# Patient Record
Sex: Female | Born: 1975 | Race: White | Hispanic: No | Marital: Single
Health system: Southern US, Community
[De-identification: ages and names within clinical notes are randomized; demographics above are authoritative.]

## PROBLEM LIST (undated history)

## (undated) DIAGNOSIS — F32A Depression, unspecified: Secondary | ICD-10-CM

## (undated) DIAGNOSIS — G709 Myoneural disorder, unspecified: Secondary | ICD-10-CM

## (undated) DIAGNOSIS — C801 Malignant (primary) neoplasm, unspecified: Secondary | ICD-10-CM

## (undated) DIAGNOSIS — M1990 Unspecified osteoarthritis, unspecified site: Secondary | ICD-10-CM

## (undated) DIAGNOSIS — K219 Gastro-esophageal reflux disease without esophagitis: Secondary | ICD-10-CM

## (undated) DIAGNOSIS — F419 Anxiety disorder, unspecified: Secondary | ICD-10-CM

## (undated) DIAGNOSIS — T7840XA Allergy, unspecified, initial encounter: Secondary | ICD-10-CM

## (undated) DIAGNOSIS — E785 Hyperlipidemia, unspecified: Secondary | ICD-10-CM

## (undated) DIAGNOSIS — G4700 Insomnia, unspecified: Secondary | ICD-10-CM

## (undated) HISTORY — DX: Unspecified osteoarthritis, unspecified site: M19.90

## (undated) HISTORY — DX: Hyperlipidemia, unspecified: E78.5

## (undated) HISTORY — PX: ABDOMINAL HYSTERECTOMY: SHX81

## (undated) HISTORY — PX: TUBAL LIGATION: SHX77

## (undated) HISTORY — DX: Malignant (primary) neoplasm, unspecified: C80.1

## (undated) HISTORY — DX: Depression, unspecified: F32.A

## (undated) HISTORY — DX: Anxiety disorder, unspecified: F41.9

## (undated) HISTORY — DX: Allergy, unspecified, initial encounter: T78.40XA

## (undated) HISTORY — DX: Insomnia, unspecified: G47.00

## (undated) HISTORY — DX: Gastro-esophageal reflux disease without esophagitis: K21.9

## (undated) HISTORY — DX: Myoneural disorder, unspecified: G70.9

---

## 2016-02-02 HISTORY — PX: OTHER SURGICAL HISTORY: SHX169

## 2016-02-03 DIAGNOSIS — C539 Malignant neoplasm of cervix uteri, unspecified: Secondary | ICD-10-CM | POA: Diagnosis not present

## 2016-06-09 DIAGNOSIS — D229 Melanocytic nevi, unspecified: Secondary | ICD-10-CM | POA: Diagnosis not present

## 2016-06-09 DIAGNOSIS — D492 Neoplasm of unspecified behavior of bone, soft tissue, and skin: Secondary | ICD-10-CM | POA: Diagnosis not present

## 2016-11-16 DIAGNOSIS — G2581 Restless legs syndrome: Secondary | ICD-10-CM | POA: Insufficient documentation

## 2016-11-16 DIAGNOSIS — F17200 Nicotine dependence, unspecified, uncomplicated: Secondary | ICD-10-CM | POA: Insufficient documentation

## 2016-11-16 DIAGNOSIS — D229 Melanocytic nevi, unspecified: Secondary | ICD-10-CM | POA: Insufficient documentation

## 2017-03-28 DIAGNOSIS — H6123 Impacted cerumen, bilateral: Secondary | ICD-10-CM | POA: Diagnosis not present

## 2017-03-28 DIAGNOSIS — I498 Other specified cardiac arrhythmias: Secondary | ICD-10-CM | POA: Diagnosis not present

## 2017-03-29 DIAGNOSIS — R002 Palpitations: Secondary | ICD-10-CM | POA: Diagnosis not present

## 2017-03-29 DIAGNOSIS — R079 Chest pain, unspecified: Secondary | ICD-10-CM | POA: Diagnosis not present

## 2019-02-19 LAB — HM PAP SMEAR: HM Pap smear: NEGATIVE

## 2019-07-27 LAB — HM MAMMOGRAPHY

## 2020-01-11 ENCOUNTER — Ambulatory Visit: Payer: Medicaid Other | Admitting: Adult Health

## 2020-01-11 ENCOUNTER — Encounter: Payer: Self-pay | Admitting: Adult Health

## 2020-01-11 ENCOUNTER — Other Ambulatory Visit: Payer: Self-pay

## 2020-01-11 VITALS — BP 126/77 | HR 88 | Temp 98.3°F | Resp 16 | Ht 69.0 in | Wt 153.6 lb

## 2020-01-11 DIAGNOSIS — Z90711 Acquired absence of uterus with remaining cervical stump: Secondary | ICD-10-CM

## 2020-01-11 DIAGNOSIS — G709 Myoneural disorder, unspecified: Secondary | ICD-10-CM | POA: Diagnosis not present

## 2020-01-11 DIAGNOSIS — Z1389 Encounter for screening for other disorder: Secondary | ICD-10-CM | POA: Insufficient documentation

## 2020-01-11 DIAGNOSIS — E559 Vitamin D deficiency, unspecified: Secondary | ICD-10-CM | POA: Diagnosis not present

## 2020-01-11 DIAGNOSIS — R350 Frequency of micturition: Secondary | ICD-10-CM

## 2020-01-11 DIAGNOSIS — Z87440 Personal history of urinary (tract) infections: Secondary | ICD-10-CM | POA: Diagnosis not present

## 2020-01-11 LAB — POCT URINALYSIS DIPSTICK
Bilirubin, UA: NEGATIVE
Blood, UA: NEGATIVE
Glucose, UA: NEGATIVE
Ketones, UA: NEGATIVE
Leukocytes, UA: NEGATIVE
Nitrite, UA: NEGATIVE
Protein, UA: NEGATIVE
Spec Grav, UA: <=1.005 — AB (ref 1.010–1.025)
Urobilinogen, UA: 0.2 E.U./dL
pH, UA: 6.5 (ref 5.0–8.0)

## 2020-01-11 MED ORDER — TRAZODONE HCL 50 MG PO TABS: 150.0000 mg | ORAL_TABLET | Freq: Every day | ORAL | 0 refills | Status: DC

## 2020-01-11 MED ORDER — TOPIRAMATE 100 MG PO TABS: 100.0000 mg | ORAL_TABLET | Freq: Every day | ORAL | 1 refills | Status: DC

## 2020-01-11 MED ORDER — CLONAZEPAM 0.5 MG PO TABS: 0.5000 mg | ORAL_TABLET | Freq: Two times a day (BID) | ORAL | 0 refills | Status: DC | PRN

## 2020-01-11 MED ORDER — FOSFOMYCIN TROMETHAMINE 3 G PO PACK: 3.0000 g | PACK | Freq: Once | ORAL | 0 refills | Status: AC

## 2020-01-11 NOTE — Patient Instructions (Addendum)
Advised patient call the office or your primary care doctor for an appointment if no improvement within 72 hours or if any symptoms change or worsen at any time  Advised ER or urgent Care if after hours or on weekend. Call 911 for emergency symptoms at any time.Patinet verbalized understanding of all instructions given/reviewed and treatment plan and has no further questions or concerns at this time.    Orders Placed This Encounter  Procedures  . CULTURE, URINE COMPREHENSIVE  . CBC with Differential/Platelet  . Comprehensive metabolic panel  . Lipid panel  . TSH  . VITAMIN D 25 Hydroxy (Vit-D Deficiency, Fractures)  . Urinalysis, microscopic only  . Ambulatory referral to Neurology    Referral Priority:   Urgent    Referral Type:   Consultation    Referral Reason:   Specialty Services Required    Requested Specialty:   Neurology    Number of Visits Requested:   1  . POCT urinalysis dipstick  requested Neuromuscular referral in comments  at [facility] - you should get a call within 2- 3 weeks let us know if you do not.   Urinary Frequency, Adult Urinary frequency means urinating more often than usual. You may urinate every 1-2 hours even though you drink a normal amount of fluid and do not have a bladder infection or condition. Although you urinate more often than normal, the total amount of urine produced in a day is normal. With urinary frequency, you may have an urgent need to urinate often. The stress and anxiety of needing to find a bathroom quickly can make this urge worse. This condition may go away on its own or you may need treatment at home. Home treatment may include bladder training, exercises, taking medicines, or making changes to your diet. Follow these instructions at home: Bladder health   Keep a bladder diary if told by your health care provider. Keep track of: ? What you eat and drink. ? How often you urinate. ? How much you urinate.  Follow a bladder training program if  told by your health care provider. This may include: ? Learning to delay going to the bathroom. ? Double urinating (voiding). This helps if you are not completely emptying your bladder. ? Scheduled voiding.  Do Kegel exercises as told by your health care provider. Kegel exercises strengthen the muscles that help control urination, which may help the condition. Eating and drinking  If told by your health care provider, make diet changes, such as: ? Avoiding caffeine. ? Drinking fewer fluids, especially alcohol. ? Not drinking in the evening. ? Avoiding foods or drinks that may irritate the bladder. These include coffee, tea, soda, artificial sweeteners, citrus, tomato-based foods, and chocolate. ? Eating foods that help prevent or ease constipation. Constipation can make this condition worse. Your health care provider may recommend that you:  Drink enough fluid to keep your urine pale yellow.  Take over-the-counter or prescription medicines.  Eat foods that are high in fiber, such as beans, whole grains, and fresh fruits and vegetables.  Limit foods that are high in fat and processed sugars, such as fried or sweet foods. General instructions  Take over-the-counter and prescription medicines only as told by your health care provider.  Keep all follow-up visits as told by your health care provider. This is important. Contact a health care provider if:  You start urinating more often.  You feel pain or irritation when you urinate.  You notice blood in your urine.  Your urine looks cloudy.  You develop a fever.  You begin vomiting. Get help right away if:  You are unable to urinate. Summary  Urinary frequency means urinating more often than usual. With urinary frequency, you may urinate every 1-2 hours even though you drink a normal amount of fluid and do not have a bladder infection or other bladder condition.  Your health care provider may recommend that you keep a bladder  diary, follow a bladder training program, or make dietary changes.  If told by your health care provider, do Kegel exercises to strengthen the muscles that help control urination.  Take over-the-counter and prescription medicines only as told by your health care provider.  Contact a health care provider if your symptoms do not improve or get worse. This information is not intended to replace advice given to you by your health care provider. Make sure you discuss any questions you have with your health care provider. Document Revised: 07/28/2017 Document Reviewed: 07/28/2017 Elsevier Patient Education  2020 [place] Maintenance, Female Adopting a healthy lifestyle and getting preventive care are important in promoting health and wellness. Ask your health care provider about:  The right schedule for you to have regular tests and exams.  Things you can do on your own to prevent diseases and keep yourself healthy. What should I know about diet, weight, and exercise? Eat a healthy diet   Eat a diet that includes plenty of vegetables, fruits, low-fat dairy products, and lean protein.  Do not eat a lot of foods that are high in solid fats, added sugars, or sodium. Maintain a healthy weight Body mass index (BMI) is used to identify weight problems. It estimates body fat based on height and weight. Your health care provider can help determine your BMI and help you achieve or maintain a healthy weight. Get regular exercise Get regular exercise. This is one of the most important things you can do for your health. Most adults should:  Exercise for at least 150 minutes each week. The exercise should increase your heart rate and make you sweat (moderate-intensity exercise).  Do strengthening exercises at least twice a week. This is in addition to the moderate-intensity exercise.  Spend less time sitting. Even light physical activity can be beneficial. Watch cholesterol and blood  lipids Have your blood tested for lipids and cholesterol at 43 years of age, then have this test every 5 years. Have your cholesterol levels checked more often if:  Your lipid or cholesterol levels are high.  You are older than 43 years of age.  You are at high risk for heart disease. What should I know about cancer screening? Depending on your health history and family history, you may need to have cancer screening at various ages. This may include screening for:  Breast cancer.  Cervical cancer.  Colorectal cancer.  Skin cancer.  Lung cancer. What should I know about heart disease, diabetes, and high blood pressure? Blood pressure and heart disease  High blood pressure causes heart disease and increases the risk of stroke. This is more likely to develop in people who have high blood pressure readings, are of African descent, or are overweight.  Have your blood pressure checked: ? Every 3-5 years if you are 62-24 years of age. ? Every year if you are 107 years old or older. Diabetes Have regular diabetes screenings. This checks your fasting blood sugar level. Have the screening done:  Once every three years after age  40 if you are at a normal weight and have a low risk for diabetes.  More often and at a younger age if you are overweight or have a high risk for diabetes. What should I know about preventing infection? Hepatitis B If you have a higher risk for hepatitis B, you should be screened for this virus. Talk with your health care provider to find out if you are at risk for hepatitis B infection. Hepatitis C Testing is recommended for:  Everyone born from [birth year] through 1964.  Anyone with known risk factors for hepatitis C. Sexually transmitted infections (STIs)  Get screened for STIs, including gonorrhea and chlamydia, if: ? You are sexually active and are younger than 43 years of age. ? You are older than 43 years of age and your health care provider tells you that  you are at risk for this type of infection. ? Your sexual activity has changed since you were last screened, and you are at increased risk for chlamydia or gonorrhea. Ask your health care provider if you are at risk.  Ask your health care provider about whether you are at high risk for HIV. Your health care provider may recommend a prescription medicine to help prevent HIV infection. If you choose to take medicine to prevent HIV, you should first get tested for HIV. You should then be tested every 3 months for as long as you are taking the medicine. Pregnancy  If you are about to stop having your period (premenopausal) and you may become pregnant, seek counseling before you get pregnant.  Take 400 to 800 micrograms (mcg) of folic acid every day if you become pregnant.  Ask for birth control (contraception) if you want to prevent pregnancy. Osteoporosis and menopause Osteoporosis is a disease in which the bones lose minerals and strength with aging. This can result in bone fractures. If you are 91 years old or older, or if you are at risk for osteoporosis and fractures, ask your health care provider if you should:  Be screened for bone loss.  Take a calcium or vitamin D supplement to lower your risk of fractures.  Be given hormone replacement therapy (HRT) to treat symptoms of menopause. Follow these instructions at home: Lifestyle  Do not use any products that contain nicotine or tobacco, such as cigarettes, e-cigarettes, and chewing tobacco. If you need help quitting, ask your health care provider.  Do not use street drugs.  Do not share needles.  Ask your health care provider for help if you need support or information about quitting drugs. Alcohol use  Do not drink alcohol if: ? Your health care provider tells you not to drink. ? You are pregnant, may be pregnant, or are planning to become pregnant.  If you drink alcohol: ? Limit how much you use to 0-1 drink a day. ? Limit  intake if you are breastfeeding.  Be aware of how much alcohol is in your drink. In the [place] one drink equals one 12 oz bottle of beer (355 mL), one 5 oz glass of wine (148 mL), or one 1 oz glass of hard liquor (44 mL). General instructions  Schedule regular health, dental, and eye exams.  Stay current with your vaccines.  Tell your health care provider if: ? You often feel depressed. ? You have ever been abused or do not feel safe at home. Summary  Adopting a healthy lifestyle and getting preventive care are important in promoting health and wellness.  Follow your health  care provider's instructions about healthy diet, exercising, and getting tested or screened for diseases.  Follow your health care provider's instructions on monitoring your cholesterol and blood pressure. This information is not intended to replace advice given to you by your health care provider. Make sure you discuss any questions you have with your health care provider. Document Revised: 01/11/2018 Document Reviewed: 01/11/2018 Elsevier Patient Education  [place]. Fosfomycin powder for oral solution What is this medicine? FOSFOMYCIN (fos foe MYE sin) is an antibiotic. It is used to treat bacterial infections of the urinary tract. This medicine may be used for other purposes; ask your health care provider or pharmacist if you have questions. COMMON BRAND NAME(S): Monurol What should I tell my health care provider before I take this medicine? They need to know if you have any of these conditions:  kidney disease  an unusual or allergic reaction to fosfomycin, other medicines, foods, dyes, or preservatives  pregnant or trying to get pregnant  breast-feeding How should I use this medicine? Take this medicine by mouth. Follow the directions on the prescription label. Mix the contents of package in 3 to 4 ounces (1/2 cup) of cold water, stir well and drink. Take this medicine with food or on an empty  stomach. Do not take your medicine more often than directed. Talk to your pediatrician regarding the use of this medicine in children. Special care may be needed. Overdosage: If you think you have taken too much of this medicine contact a poison control center or emergency room at once. NOTE: This medicine is only for you. Do not share this medicine with others. What if I miss a dose? This does not apply. This medicine is taken as a one-time dose. What may interact with this medicine?  metoclopramide This list may not describe all possible interactions. Give your health care provider a list of all the medicines, herbs, non-prescription drugs, or dietary supplements you use. Also tell them if you smoke, drink alcohol, or use illegal drugs. Some items may interact with your medicine. What should I watch for while using this medicine? Tell your doctor or health care professional if your symptoms do not start to get better in 2 to 3 days or if they get worse. What side effects may I notice from receiving this medicine? Side effects that you should report to your doctor or health care professional as soon as possible:  allergic reactions like skin rash, itching or hives, swelling of the face, lips, or tongue  breathing problems  changes in vision  fever, flu-like symptoms  unusually weak or tired  yellowing of eyes or skin Side effects that usually do not require medical attention (report to your doctor or health care professional if they continue or are bothersome):  diarrhea  dizziness  headache  runny nose  sore throat  stomach upset, nausea  vaginal itch or irritation This list may not describe all possible side effects. Call your doctor for medical advice about side effects. You may report side effects to FDA at 1-800-FDA-1088. Where should I keep my medicine? Keep out of the reach of children. Store at room temperature between 15 and 30 degrees C (59 and 86 degrees F).  Keep container tightly closed. Throw away any unused medicine after the expiration date. NOTE: This sheet is a summary. It may not cover all possible information. If you have questions about this medicine, talk to your doctor, pharmacist, or health care provider.  2020 Elsevier/Gold Standard (2007-05-24  13:38:28)  

## 2020-01-11 NOTE — Progress Notes (Signed)
New patient visit   Patient: Mikayla Walsh   DOB: [birth year]   44 y.o. Female  MRN: [id number] Visit Date: 01/11/2020  Today's healthcare provider: Marcille Buffy, FNP   Chief Complaint  Patient presents with  . New Patient (Initial Visit)   Subjective    Anglea Walsh is a 44 y.o. female who presents today as a new patient to establish care.  HPI  Patient comes to our office from [facility] in [place]. Patient reports that she feels fairly well today but would like to address concern of urinary urgency and frequency. In general patient reports that she follows a well balanced diet, she is staying active by exercising and states that her sleep patterns are poor.   Urinary symptoms since she had hysterectomy January 2018, and she has a  History of urinary symptoms and infections since that time.She had a urology appointment in [place] and it got canceled. Urgency at night x 2 days. She reports she had this last in September and due to multiple allergies she was give fosfomycin x 1 dose and all her symptoms resolved.    History of neuromuscular  syndrome, leaning towards MS and needs spinal type. She needs a neuromuscular physician now was told she is outside the scope of the neurologist, wants to go [place].   Denies any concerns for STD's.   Klonopin for tremors and muscle spasms from neurologist and Topamax 100 mg once a day.   Patient states that on average she sleeps 3hrs a night. 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 No other treatments. She has both ovaries.   Patient  denies any fever, body aches,chills, rash, chest pain, shortness of breath, nausea, vomiting, or diarrhea.  Denies dizziness, lightheadedness, pre syncopal or syncopal episodes.     Allergies  Allergen Reactions  . Macrobid [Nitrofurantoin]  Other (See Comments)    Chest pain  . Morphine And Related Hives and Swelling  . Penicillins Hives    Redness, itching  . Prednisone Hives and Swelling  . Sulfa Antibiotics Hives    Itching and redness      Past Medical History:  Diagnosis Date  . Allergy   . Cancer ([place])   . Insomnia   . Neuromuscular disorder [facility])    Past Surgical History:  Procedure Laterality Date  . ABDOMINAL HYSTERECTOMY    . cervisectomy  02/2016  . CESAREAN SECTION    . TUBAL LIGATION     Family Status  Relation Name Status  . Mother  (Not Specified)  . Father  (Not Specified)  . Son  (Not Specified)  . MGF  (Not Specified)  . PGM  (Not Specified)  . PGF  (Not Specified)   Family History  Problem Relation Age of Onset  . Hypertension Mother   . Hypertension Father   . Hyperlipidemia Father   . Cancer Father   . Asthma Son   . Cancer Maternal Grandfather   . Kidney disease Paternal Grandmother   . Alzheimer's disease Paternal Grandfather    Social History   Socioeconomic History  . Marital status: Divorced    Spouse name: Not on file  . Number of children: Not on file  . Years of education: Not on file  . Highest education level: Not on file  Occupational History  . Not on file  Tobacco Use  . Smoking status: Never Smoker  . Smokeless tobacco: Never Used  Substance and Sexual Activity  . Alcohol use: Yes    Alcohol/week: 1.0 standard drink    Types: 1 Glasses of wine per week  . Drug use: Never  . Sexual activity: Yes  Other Topics Concern  . Not on file  Social History Narrative  . Not on file   Social Determinants of Health   Financial Resource Strain: Not on file  Food Insecurity: Not on file  Transportation Needs: Not on file  Physical Activity: Not on file  Stress: Not on file  Social Connections: Not on file   Outpatient Medications Prior to Visit  Medication Sig  . OVER THE COUNTER MEDICATION Probiotic  . Phenazopyridine HCl (PYRIDIUM PO) Take by mouth  as needed.  . [DISCONTINUED] clonazePAM (KLONOPIN) 0.5 MG tablet Take 0.5 mg by mouth 2 (two) times daily as needed for anxiety.  . [DISCONTINUED] topiramate (TOPAMAX) 100 MG tablet Take 100 mg by mouth 2 (two) times daily.  . [DISCONTINUED] traZODone (DESYREL) 50 MG tablet Take 50 mg by mouth 3 (three) times daily.   No facility-administered medications prior to visit.   Allergies  Allergen Reactions  . Macrobid [Nitrofurantoin] Other (See Comments)    Chest pain  . Morphine And Related Hives and Swelling  . Penicillins Hives    Redness, itching  . Prednisone Hives and Swelling  . Sulfa Antibiotics Hives    Itching and redness     There is no immunization history on file for this patient.  Health Maintenance  Topic Date Due  . Hepatitis C Screening  Never done  . HIV Screening  Never done  . TETANUS/TDAP  Never done  . PAP SMEAR-Modifier  Never done  . INFLUENZA VACCINE  Never done    Patient Care Team: Doreen Beam, FNP as PCP - General (Family Medicine)  Review of Systems  Constitutional: Positive for fatigue.  Endocrine: Positive for cold intolerance, heat intolerance and polyuria.  Genitourinary: Positive for frequency and urgency.  Musculoskeletal: Positive for arthralgias and myalgias.  Neurological: Positive for tremors, weakness and numbness.  Psychiatric/Behavioral: Positive for sleep disturbance.  All other systems reviewed and are negative.     Objective    BP 126/77   Pulse 88   Temp 98.3 F (36.8 C) (Oral)   Resp 16   Ht 5\' 9"  (1.753 m)   Wt 153 lb 9.6 oz (69.7 kg)   SpO2 100%   BMI 22.68 kg/m  Physical Exam Vitals reviewed.  Constitutional:      General: She is active. She is not in acute distress.    Appearance: She is well-developed and well-nourished. She is not diaphoretic.     Interventions: She is not intubated. HENT:     Head: Normocephalic and atraumatic.     Right Ear: External ear normal. There is no impacted cerumen.      Left Ear: External ear normal. There is no impacted cerumen.     Nose: Nose normal. No congestion or rhinorrhea.     Mouth/Throat:     Pharynx: No oropharyngeal exudate or posterior oropharyngeal erythema.  Eyes:     General: Lids are normal. No scleral icterus.       Right eye: No discharge.        Left eye: No discharge.     Extraocular Movements: EOM normal.     Conjunctiva/sclera: Conjunctivae normal.     Right eye: Right  conjunctiva is not injected. No exudate or hemorrhage.    Left eye: Left conjunctiva is not injected. No exudate or hemorrhage.    Pupils: Pupils are equal, round, and reactive to light.  Neck:     Thyroid: No thyroid mass or thyromegaly.     Vascular: Normal carotid pulses. No carotid bruit, hepatojugular reflux or JVD.     Trachea: Trachea and phonation normal. No tracheal tenderness or tracheal deviation.     Meningeal: Brudzinski's sign and Kernig's sign absent.  Cardiovascular:     Rate and Rhythm: Normal rate and regular rhythm.     Pulses: Normal pulses and intact distal pulses.          Radial pulses are 2+ on the right side and 2+ on the left side.       Dorsalis pedis pulses are 2+ on the right side and 2+ on the left side.       Posterior tibial pulses are 2+ on the right side and 2+ on the left side.     Heart sounds: Normal heart sounds, S1 normal and S2 normal. Heart sounds not distant. No murmur heard. No friction rub. No gallop.   Pulmonary:     Effort: Pulmonary effort is normal. No tachypnea, bradypnea, accessory muscle usage, respiratory distress or apnea. She is not intubated.     Breath sounds: Normal breath sounds. No stridor. No wheezing or rales.  Chest:     Chest wall: No tenderness.  Breasts:     Right: No supraclavicular adenopathy.     Left: No supraclavicular adenopathy.    Abdominal:     General: Bowel sounds are normal. There is no distension, ascites or abdominal bruit. Aorta is normal.     Palpations: Abdomen is soft.  There is no shifting dullness, fluid wave, hepatomegaly, splenomegaly, hepatosplenomegaly, mass, pulsatile mass or pulsatile liver.     Tenderness: There is no abdominal tenderness. There is no CVA tenderness, guarding or rebound.     Hernia: No hernia is present.  Musculoskeletal:        General: No tenderness, deformity or edema. Normal range of motion.     Cervical back: Full passive range of motion without pain, normal range of motion and neck supple. No edema, erythema or rigidity. No spinous process tenderness or muscular tenderness. Normal range of motion.  Lymphadenopathy:     Head:     Right side of head: No submental, submandibular, tonsillar, preauricular, posterior auricular or occipital adenopathy.     Left side of head: No submental, submandibular, tonsillar, preauricular, posterior auricular or occipital adenopathy.     Cervical: No cervical adenopathy.     Right cervical: No superficial, deep or posterior cervical adenopathy.    Left cervical: No superficial, deep or posterior cervical adenopathy.     Upper Body:  No axillary adenopathy present.    Right upper body: No supraclavicular, pectoral or lateral adenopathy.     Left upper body: No supraclavicular, pectoral or lateral adenopathy.  Skin:    General: Skin is warm, dry and intact.     Coloration: Skin is not pale.     Findings: No abrasion, bruising, burn, ecchymosis, erythema, lesion, petechiae or rash.     Nails: There is no clubbing or cyanosis.  Neurological:     Mental Status: She is alert and oriented to person, place, and time.     GCS: GCS eye subscore is 4. GCS verbal subscore is 5. GCS motor subscore is 6.  Cranial Nerves: No cranial nerve deficit.     Sensory: No sensory deficit.     Motor: No tremor, atrophy, abnormal muscle tone or seizure activity.     Coordination: She displays a negative Romberg sign. Coordination normal.     Gait: Gait normal.     Deep Tendon Reflexes: Strength normal and  reflexes are normal and symmetric. Reflexes normal. Babinski sign absent on the right side. Babinski sign absent on the left side.     Reflex Scores:      Tricep reflexes are 2+ on the right side and 2+ on the left side.      Bicep reflexes are 2+ on the right side and 2+ on the left side.      Brachioradialis reflexes are 2+ on the right side and 2+ on the left side.      Patellar reflexes are 2+ on the right side and 2+ on the left side.      Achilles reflexes are 2+ on the right side and 2+ on the left side. Psychiatric:        Mood and Affect: Mood and affect normal.        Speech: Speech normal.        Behavior: Behavior normal.        Thought Content: Thought content normal.        Cognition and Memory: Cognition and memory normal.        Judgment: Judgment normal.    Recheck manual heart rate 88  Depression Screen PHQ 2/9 Scores 01/11/2020  PHQ - 2 Score 0  PHQ- 9 Score 0   Results for orders placed or performed in visit on 01/11/20  POCT urinalysis dipstick  Result Value Ref Range   Color, UA yellow    Clarity, UA clear    Glucose, UA Negative Negative   Bilirubin, UA negative    Ketones, UA negative    Spec Grav, UA <=1.005 (A) 1.010 - 1.025   Blood, UA negative    pH, UA 6.5 5.0 - 8.0   Protein, UA Negative Negative   Urobilinogen, UA 0.2 0.2 or 1.0 E.U./dL   Nitrite, UA negative    Leukocytes, UA Negative Negative   Appearance     Odor      Assessment & Plan       Urinary frequency - Plan: Urinalysis, microscopic only, CULTURE, URINE COMPREHENSIVE  Screening for blood or protein in urine - Plan: POCT urinalysis dipstick, Urinalysis, microscopic only, CULTURE, URINE COMPREHENSIVE  Neuromuscular disease (HCC) - Plan: CBC with Differential/Platelet, Comprehensive metabolic panel, Lipid panel, TSH, Urinalysis, microscopic only, Ambulatory referral to Neurology  History of recurrent urinary tract infection  History of partial hysterectomy- has both ovaries-  adenocarcinoma.   Vitamin D insufficiency - Plan: VITAMIN D 25 Hydroxy (Vit-D Deficiency, Fractures)  Orders Placed This Encounter  Procedures  . CULTURE, URINE COMPREHENSIVE  . CBC with Differential/Platelet  . Comprehensive metabolic panel  . Lipid panel  . TSH  . VITAMIN D 25 Hydroxy (Vit-D Deficiency, Fractures)  . Urinalysis, microscopic only  . Ambulatory referral to Neurology  . POCT urinalysis dipstick    Increase hydration.   referral is for neuromuscular consult at [facility], she was told by neurology in [place] this is what she needs. Request for records was obtained.  Will give fosfomycin send for culture and microscopic if within normal limits advise urology referral given history.  Records were requested.    Red Flags discussed. The patient was  given clear instructions to go to ER or return to medical center if any red flags develop, symptoms do not improve, worsen or new problems develop. They verbalized understanding.  Return in about 3 months (around 04/10/2020), or if symptoms worsen or fail to improve, for at any time for any worsening symptoms, Go to Emergency room/ urgent care if worse.    The entirety of the information documented in the History of Present Illness, Review of Systems and Physical Exam were personally obtained by me. Portions of this information were initially documented by the CMA and reviewed by me for thoroughness and accuracy.    I spent 40 dedicated to the care of this patient on the date of this encounter to include pre-visit review of records, face-to-face time with the  patient discussing The primary encounter diagnosis was Urinary frequency. Diagnoses of Screening for blood or protein in urine, Neuromuscular disease ([place]), History of recurrent urinary tract infection, History of partial hysterectomy- has both ovaries- adenocarcinoma. , and Vitamin D insufficiency were also pertinent to this visit.  and post visit ordering of testing/ referrals  and discussing new patient office.   Marcille Buffy, [place] [phone] (phone) [phone] (fax)  [place]

## 2020-01-12 LAB — CBC WITH DIFFERENTIAL/PLATELET
Basophils Absolute: 0 10*3/uL (ref 0.0–0.2)
Basos: 0 %
EOS (ABSOLUTE): 0 10*3/uL (ref 0.0–0.4)
Eos: 0 %
Hematocrit: 47 % — ABNORMAL HIGH (ref 34.0–46.6)
Hemoglobin: 15.8 g/dL (ref 11.1–15.9)
Immature Grans (Abs): 0 10*3/uL (ref 0.0–0.1)
Immature Granulocytes: 0 %
Lymphocytes Absolute: 2.1 10*3/uL (ref 0.7–3.1)
Lymphs: 21 %
MCH: 30.4 pg (ref 26.6–33.0)
MCHC: 33.6 g/dL (ref 31.5–35.7)
MCV: 90 fL (ref 79–97)
Monocytes Absolute: 0.6 10*3/uL (ref 0.1–0.9)
Monocytes: 6 %
Neutrophils Absolute: 7.2 10*3/uL — ABNORMAL HIGH (ref 1.4–7.0)
Neutrophils: 73 %
Platelets: 252 10*3/uL (ref 150–450)
RBC: 5.2 x10E6/uL (ref 3.77–5.28)
RDW: 11.7 % (ref 11.7–15.4)
WBC: 10 10*3/uL (ref 3.4–10.8)

## 2020-01-12 LAB — COMPREHENSIVE METABOLIC PANEL
ALT: 12 IU/L (ref 0–32)
AST: 11 IU/L (ref 0–40)
Albumin/Globulin Ratio: 1.7 (ref 1.2–2.2)
Albumin: 4.5 g/dL (ref 3.8–4.8)
Alkaline Phosphatase: 102 IU/L (ref 44–121)
BUN/Creatinine Ratio: 7 — ABNORMAL LOW (ref 9–23)
BUN: 6 mg/dL (ref 6–24)
Bilirubin Total: 0.3 mg/dL (ref 0.0–1.2)
CO2: 21 mmol/L (ref 20–29)
Calcium: 9.6 mg/dL (ref 8.7–10.2)
Chloride: 104 mmol/L (ref 96–106)
Creatinine, Ser: 0.86 mg/dL (ref 0.57–1.00)
GFR calc Af Amer: 96 mL/min/{1.73_m2} (ref >59)
GFR calc non Af Amer: 83 mL/min/{1.73_m2} (ref >59)
Globulin, Total: 2.7 g/dL (ref 1.5–4.5)
Glucose: 94 mg/dL (ref 65–99)
Potassium: 4.5 mmol/L (ref 3.5–5.2)
Sodium: 141 mmol/L (ref 134–144)
Total Protein: 7.2 g/dL (ref 6.0–8.5)

## 2020-01-12 LAB — LIPID PANEL
Chol/HDL Ratio: 6.6 ratio — ABNORMAL HIGH (ref 0.0–4.4)
Cholesterol, Total: 232 mg/dL — ABNORMAL HIGH (ref 100–199)
HDL: 35 mg/dL — ABNORMAL LOW (ref >39)
LDL Chol Calc (NIH): 150 mg/dL — ABNORMAL HIGH (ref 0–99)
Triglycerides: 257 mg/dL — ABNORMAL HIGH (ref 0–149)
VLDL Cholesterol Cal: 47 mg/dL — ABNORMAL HIGH (ref 5–40)

## 2020-01-12 LAB — URINALYSIS, MICROSCOPIC ONLY
Bacteria, UA: NONE SEEN
Casts: NONE SEEN /lpf
Epithelial Cells (non renal): NONE SEEN /hpf (ref 0–10)
RBC, Urine: NONE SEEN /hpf (ref 0–2)
WBC, UA: NONE SEEN /hpf (ref 0–5)

## 2020-01-12 LAB — TSH: TSH: 0.844 u[IU]/mL (ref 0.450–4.500)

## 2020-01-12 LAB — VITAMIN D 25 HYDROXY (VIT D DEFICIENCY, FRACTURES): Vit D, 25-Hydroxy: 25.4 ng/mL — ABNORMAL LOW (ref 30.0–100.0)

## 2020-01-15 ENCOUNTER — Other Ambulatory Visit: Payer: Self-pay | Admitting: Adult Health

## 2020-01-15 ENCOUNTER — Encounter: Payer: Self-pay | Admitting: Adult Health

## 2020-01-15 DIAGNOSIS — E559 Vitamin D deficiency, unspecified: Secondary | ICD-10-CM

## 2020-01-15 DIAGNOSIS — E785 Hyperlipidemia, unspecified: Secondary | ICD-10-CM

## 2020-01-15 MED ORDER — VITAMIN D (ERGOCALCIFEROL) 1.25 MG (50000 UNIT) PO CAPS: 50000.0000 [IU] | ORAL_CAPSULE | ORAL | 0 refills | Status: DC

## 2020-01-15 NOTE — Progress Notes (Signed)
CBC and CMP ok.  Total cholesterol a triglycerides, and LDL elevated.  Discuss lifestyle modification with patient e.g. increase exercise, fiber, fruits, vegetables, lean meat, and omega 3/fish intake and decrease saturated fat.  HDL good cholesterol is low. If patient following strict diet and exercise program already please schedule follow up appointment with primary care physician to discuss medication management. Verify if she has been fasting for an entire 8 hours  ?  If she was not truly fasting I have added on a repeat lipid panel she can repeat.  TSH within normal.   Vitamin D is 25.4 low, recommend Vitamin D prescription at 50,000 units by mouth once every 7 days( ONCE WEEKLY) for 12 weeks. Repeat lab in 1-2 weeks after completing prescription.

## 2020-01-16 LAB — CULTURE, URINE COMPREHENSIVE

## 2020-01-30 ENCOUNTER — Telehealth: Payer: Self-pay

## 2020-01-30 NOTE — Telephone Encounter (Signed)
Copied from CRM [id number]. Topic: General - Inquiry >> Jan 30, 2020  2:30 PM Adrian Prince D wrote: Reason for CRM: a call in from [facility] and they need her previous neurologist notes. [phone] [facility], they handle referrals. Please advise

## 2020-01-30 NOTE — Telephone Encounter (Signed)
Contacted number back and spoke with Arlys John I advised him that patient has only been seen in our office one time as a new patient and has had no images scanned or ordered. KW

## 2020-01-30 NOTE — Telephone Encounter (Signed)
Please see previous message. KW 

## 2020-01-30 NOTE — Telephone Encounter (Signed)
Copied from CRM [id number]. Topic: General - Other >> Jan 30, 2020  4:06 PM Gaetana Michaelis A wrote: Reason for CRM: Tresa Endo from [facility] would like neurology notes and MRIs forwarded to [facility] at Phone: [phone] FAX: [phone]

## 2020-01-31 ENCOUNTER — Ambulatory Visit: Payer: Self-pay | Admitting: Physician Assistant

## 2020-02-21 ENCOUNTER — Telehealth: Payer: Self-pay

## 2020-02-21 NOTE — Telephone Encounter (Signed)
Copied from [place] [id number]. Topic: General - Other >> Feb 21, 2020  3:18 PM Tessa Lerner A wrote: Reason for CRM: Claiborne Billings from [facility] called to notify PCP that referral was received for patient and their office will reach out to patient for scheduling

## 2020-02-21 NOTE — Telephone Encounter (Signed)
Noted. Thanks.

## 2020-03-14 ENCOUNTER — Emergency Department
Admission: EM | Admit: 2020-03-14 | Discharge: 2020-03-14 | Disposition: A | Discharge status: Home / Self Care | Payer: Medicaid Other | Attending: Emergency Medicine | Admitting: Emergency Medicine

## 2020-03-14 ENCOUNTER — Other Ambulatory Visit: Payer: Self-pay

## 2020-03-14 ENCOUNTER — Emergency Department: Payer: Medicaid Other

## 2020-03-14 DIAGNOSIS — D72829 Elevated white blood cell count, unspecified: Secondary | ICD-10-CM | POA: Diagnosis not present

## 2020-03-14 DIAGNOSIS — R109 Unspecified abdominal pain: Secondary | ICD-10-CM

## 2020-03-14 DIAGNOSIS — R000 Tachycardia, unspecified: Secondary | ICD-10-CM | POA: Insufficient documentation

## 2020-03-14 DIAGNOSIS — N200 Calculus of kidney: Secondary | ICD-10-CM | POA: Diagnosis not present

## 2020-03-14 DIAGNOSIS — R110 Nausea: Secondary | ICD-10-CM | POA: Insufficient documentation

## 2020-03-14 DIAGNOSIS — R748 Abnormal levels of other serum enzymes: Secondary | ICD-10-CM | POA: Insufficient documentation

## 2020-03-14 DIAGNOSIS — Z859 Personal history of malignant neoplasm, unspecified: Secondary | ICD-10-CM | POA: Diagnosis not present

## 2020-03-14 DIAGNOSIS — R1033 Periumbilical pain: Secondary | ICD-10-CM | POA: Diagnosis not present

## 2020-03-14 DIAGNOSIS — R1011 Right upper quadrant pain: Secondary | ICD-10-CM

## 2020-03-14 DIAGNOSIS — R1031 Right lower quadrant pain: Secondary | ICD-10-CM | POA: Diagnosis not present

## 2020-03-14 LAB — URINALYSIS, COMPLETE (UACMP) WITH MICROSCOPIC
Bilirubin Urine: NEGATIVE
Glucose, UA: NEGATIVE mg/dL
Hgb urine dipstick: NEGATIVE
Ketones, ur: NEGATIVE mg/dL
Leukocytes,Ua: NEGATIVE
Nitrite: NEGATIVE
Protein, ur: NEGATIVE mg/dL
Specific Gravity, Urine: 1.005 (ref 1.005–1.030)
pH: 5 (ref 5.0–8.0)

## 2020-03-14 LAB — COMPREHENSIVE METABOLIC PANEL
ALT: 17 U/L (ref 0–44)
AST: 18 U/L (ref 15–41)
Albumin: 4.9 g/dL (ref 3.5–5.0)
Alkaline Phosphatase: 84 U/L (ref 38–126)
Anion gap: 11 (ref 5–15)
BUN: 7 mg/dL (ref 6–20)
CO2: 24 mmol/L (ref 22–32)
Calcium: 9.5 mg/dL (ref 8.9–10.3)
Chloride: 104 mmol/L (ref 98–111)
Creatinine, Ser: 0.7 mg/dL (ref 0.44–1.00)
GFR, Estimated: >60 mL/min (ref >60)
Glucose, Bld: 102 mg/dL — ABNORMAL HIGH (ref 70–99)
Potassium: 3.4 mmol/L — ABNORMAL LOW (ref 3.5–5.1)
Sodium: 139 mmol/L (ref 135–145)
Total Bilirubin: 0.5 mg/dL (ref 0.3–1.2)
Total Protein: 8.5 g/dL — ABNORMAL HIGH (ref 6.5–8.1)

## 2020-03-14 LAB — CK: Total CK: 58 U/L (ref 38–234)

## 2020-03-14 LAB — CBC
HCT: 47.6 % — ABNORMAL HIGH (ref 36.0–46.0)
Hemoglobin: 16.1 g/dL — ABNORMAL HIGH (ref 12.0–15.0)
MCH: 31.1 pg (ref 26.0–34.0)
MCHC: 33.8 g/dL (ref 30.0–36.0)
MCV: 91.9 fL (ref 80.0–100.0)
Platelets: 233 10*3/uL (ref 150–400)
RBC: 5.18 MIL/uL — ABNORMAL HIGH (ref 3.87–5.11)
RDW: 12 % (ref 11.5–15.5)
WBC: 12.7 10*3/uL — ABNORMAL HIGH (ref 4.0–10.5)
nRBC: 0 % (ref 0.0–0.2)

## 2020-03-14 LAB — LIPASE, BLOOD: Lipase: 71 U/L — ABNORMAL HIGH (ref 11–51)

## 2020-03-14 LAB — LACTIC ACID, PLASMA: Lactic Acid, Venous: 1.3 mmol/L (ref 0.5–1.9)

## 2020-03-14 MED ORDER — FENTANYL CITRATE (PF) 100 MCG/2ML IJ SOLN
100.0000 ug | Freq: Once | INTRAMUSCULAR | Status: AC
  Administered 2020-03-14: 100 ug via INTRAVENOUS
  Filled 2020-03-14: qty 2

## 2020-03-14 MED ORDER — OXYCODONE-ACETAMINOPHEN 5-325 MG PO TABS: 1.0000 | ORAL_TABLET | ORAL | 0 refills | Status: DC | PRN

## 2020-03-14 MED ORDER — IOHEXOL 300 MG/ML  SOLN
100.0000 mL | Freq: Once | INTRAMUSCULAR | Status: AC | PRN
  Administered 2020-03-14: 100 mL via INTRAVENOUS
  Filled 2020-03-14: qty 100

## 2020-03-14 MED ORDER — ONDANSETRON 4 MG PO TBDP: 4.0000 mg | ORAL_TABLET | Freq: Three times a day (TID) | ORAL | 0 refills | Status: DC | PRN

## 2020-03-14 MED ORDER — FENTANYL CITRATE (PF) 100 MCG/2ML IJ SOLN
50.0000 ug | Freq: Once | INTRAMUSCULAR | Status: AC
Start: 2020-03-14 — End: 2020-03-14
  Administered 2020-03-14: 50 ug via INTRAVENOUS
  Filled 2020-03-14: qty 2

## 2020-03-14 NOTE — ED Provider Notes (Signed)
[facility] Emergency Department Provider Note  ____________________________________________   Event Date/Time   First MD Initiated Contact with Patient 03/14/20 2022     (approximate)  I have reviewed the triage vital signs and the nursing notes.   HISTORY  Chief Complaint Abdominal Pain    HPI Mikayla Walsh is a 45 y.o. female presents emergency department complaint of right lower quadrant pain that started this morning.  States it is worsened throughout the day and became much worse when she was walking with her son.  Patient states that she still has her gallbladder and appendix.  Pain has been located near the umbilicus.  Some nausea.  No vomiting or diarrhea.  Normal bowel movement yesterday.    Past Medical History:  Diagnosis Date  . Allergy   . Cancer ([place])   . Insomnia   . Neuromuscular disorder [facility])     Patient Active Problem List   Diagnosis Date Noted  . Screening for blood or protein in urine 01/11/2020  . Urinary frequency 01/11/2020  . History of recurrent urinary tract infection 01/11/2020  . Neuromuscular disease ([place]) 01/11/2020  . 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  01/11/2020  . Vitamin D insufficiency 01/11/2020    Past Surgical History:  Procedure Laterality Date  . ABDOMINAL HYSTERECTOMY    . cervisectomy  02/2016  . CESAREAN SECTION    . TUBAL LIGATION      Prior to Admission medications   Medication Sig Start Date End Date Taking? Authorizing Provider  ondansetron (ZOFRAN-ODT) 4 MG disintegrating tablet Take 1 tablet (4 mg total) by mouth every 8 (eight) hours as needed. 03/14/20  Yes Latice Waitman, Linden Dolin, PA-C  oxyCODONE-acetaminophen (PERCOCET) 5-325 MG tablet Take 1 tablet by mouth every 4 (four) hours as needed for severe pain. 03/14/20 03/14/21 Yes Elian Gloster, Linden Dolin, PA-C  clonazePAM (KLONOPIN) 0.5 MG tablet Take 1 tablet (0.5 mg total) by mouth 2 (two) times daily as needed for anxiety.  01/11/20   Flinchum, Kelby Aline, FNP  OVER THE COUNTER MEDICATION Probiotic    [provider]  Phenazopyridine HCl (PYRIDIUM PO) Take by mouth as needed.    [provider]  topiramate (TOPAMAX) 100 MG tablet Take 1 tablet (100 mg total) by mouth daily. 01/11/20   Flinchum, Kelby Aline, FNP  traZODone (DESYREL) 50 MG tablet Take 3 tablets (150 mg total) by mouth at bedtime. 01/11/20   Flinchum, Kelby Aline, FNP  Vitamin D, Ergocalciferol, (DRISDOL) 1.25 MG (50000 UNIT) CAPS capsule Take 1 capsule (50,000 Units total) by mouth every 7 (seven) days. (taking one tablet per week) walk in lab in office 1-2 weeks after completing prescription. 01/15/20   Flinchum, Kelby Aline, FNP    Allergies Macrobid [nitrofurantoin], Morphine and related, Penicillins, Prednisone, and Sulfa antibiotics  Family History  Problem Relation Age of Onset  . Hypertension Mother   . Hypertension Father   . Hyperlipidemia Father   . Cancer Father   . Asthma Son   . Cancer Maternal Grandfather   . Kidney disease Paternal Grandmother   . Alzheimer's disease Paternal Grandfather     Social History Social History   Tobacco Use  . Smoking status: Never Smoker  . Smokeless tobacco: Never Used  Substance Use Topics  . Alcohol use: Yes    Alcohol/week: 1.0 standard drink    Types: 1 Glasses of wine per week  . Drug use: Never    Review of Systems  Constitutional: No fever/chills Eyes:  No visual changes. ENT: No sore throat. Respiratory: Denies cough Cardiovascular: Denies chest pain Gastrointestinal: Positive for right lower quadrant abdominal pain Genitourinary: Negative for dysuria. Musculoskeletal: Negative for back pain. Skin: Negative for rash. Psychiatric: no mood changes,     ____________________________________________   PHYSICAL EXAM:  VITAL SIGNS: ED Triage Vitals  Enc Vitals Group     BP 03/14/20 1831 91/66     Pulse Rate 03/14/20 1831 (!) 117     Resp 03/14/20 1831  16     Temp 03/14/20 1831 99.1 F (37.3 C)     Temp src --      SpO2 03/14/20 1831 99 %     Weight 03/14/20 1839 145 lb (65.8 kg)     Height 03/14/20 1839 5\' 9"  (1.753 m)     Head Circumference --      Peak Flow --      Pain Score 03/14/20 1831 8     Pain Loc --      Pain Edu? --      Excl. in [place]? --     Constitutional: Alert and oriented. Well appearing and in no acute distress. Eyes: Conjunctivae are normal.  Head: Atraumatic. Nose: No congestion/rhinnorhea. Mouth/Throat: Mucous membranes are moist.  Neck:  supple no lymphadenopathy noted Cardiovascular: Normal rate, regular rhythm. Heart sounds are normal Respiratory: Normal respiratory effort.  No retractions, lungs c t a  Abd: soft tender near the right side flank, and umbilicus, bs normal all 4 quad GU: deferred Musculoskeletal: FROM all extremities, warm and well perfused Neurologic:  Normal speech and language.  Skin:  Skin is warm, dry and intact. No rash noted. Psychiatric: Mood and affect are normal. Speech and behavior are normal.  ____________________________________________   LABS (all labs ordered are listed, but only abnormal results are displayed)  Labs Reviewed  LIPASE, BLOOD - Abnormal; Notable for the following components:      Result Value   Lipase 71 (*)    All other components within normal limits  COMPREHENSIVE METABOLIC PANEL - Abnormal; Notable for the following components:   Potassium 3.4 (*)    Glucose, Bld 102 (*)    Total Protein 8.5 (*)    All other components within normal limits  CBC - Abnormal; Notable for the following components:   WBC 12.7 (*)    RBC 5.18 (*)    Hemoglobin 16.1 (*)    HCT 47.6 (*)    All other components within normal limits  URINALYSIS, COMPLETE (UACMP) WITH MICROSCOPIC - Abnormal; Notable for the following components:   Color, Urine YELLOW (*)    APPearance CLEAR (*)    Bacteria, UA RARE (*)    All other components within normal limits  LACTIC ACID, PLASMA   CK   ____________________________________________   ____________________________________________  RADIOLOGY  CT abdomen/pelvis IV contrast  ____________________________________________   PROCEDURES  Procedure(s) performed: No  Procedures    ____________________________________________   INITIAL IMPRESSION / ASSESSMENT AND PLAN / ED COURSE  Pertinent labs & imaging results that were available during my care of the patient were reviewed by me and considered in my medical decision making (see chart for details).   Patient is 45 year old female presents with right lower quadrant pain.  See HPI.  Physical exam shows the patient to be tender along the right flank and near the umbilicus.  DDx: Acute appendicitis, acute cholecystitis, bowel obstruction, acute pancreatitis  CBC has elevated WBC of 12.7, comprehensive metabolic panel has potassium 3.4, glucose 102,  lipase is slightly elevated at 71, lactic acid is normal at 1.3, CK is normal at 58, UA is normal  CT abdomen/pelvis with IV contrast ordered   EKG shows tachycardia, see physician read  CT abdomen/pelvis does not show any acute abnormality, ultrasound of the right upper quadrant does not show any acute abnormality  The patient does not have an appendicitis, acute cholecystitis, she may be at the beginning of pancreatitis.  We did discuss this.  She is to take pain medication as needed.  We discussed eating a well-balanced diet with low-fat.  She is to return to the emergency department if worsening.  She is discharged stable condition.  Mikayla Walsh was evaluated in Emergency Department on 03/14/2020 for the symptoms described in the history of present illness. She was evaluated in the context of the global COVID-19 pandemic, which necessitated consideration that the patient might be at risk for infection with the SARS-CoV-2 virus that causes COVID-19. Institutional protocols and algorithms that pertain to the evaluation  of patients at risk for COVID-19 are in a state of rapid change based on information released by regulatory bodies including the CDC and federal and state organizations. These policies and algorithms were followed during the patient's care in the ED.    As part of my medical decision making, I reviewed the following data within the [facility] notes reviewed and incorporated, Labs reviewed , Old chart reviewed, Radiograph reviewed , Notes from prior ED visits and [place] Controlled Substance Database  ____________________________________________   FINAL CLINICAL IMPRESSION(S) / ED DIAGNOSES  Final diagnoses:  RUQ pain  Acute abdominal pain      NEW MEDICATIONS STARTED DURING THIS VISIT:  New Prescriptions   ONDANSETRON (ZOFRAN-ODT) 4 MG DISINTEGRATING TABLET    Take 1 tablet (4 mg total) by mouth every 8 (eight) hours as needed.   OXYCODONE-ACETAMINOPHEN (PERCOCET) 5-325 MG TABLET    Take 1 tablet by mouth every 4 (four) hours as needed for severe pain.     Note:  This document was prepared using Dragon voice recognition software and may include unintentional dictation errors.    Versie Starks, PA-C 03/14/20 2303    Naaman Plummer, MD 03/14/20 [phone]

## 2020-03-14 NOTE — ED Notes (Signed)
Patient transported to [facility] with technician in wheelchair

## 2020-03-14 NOTE — ED Triage Notes (Signed)
Pt to ED POV for chief complaint of RLQ pain that started this morning and has worsened over day.  +nausea Denies diarrhea, urinary sx  Pt states her HR is "always high and has been since childhood"

## 2020-03-14 NOTE — Discharge Instructions (Addendum)
Follow-up with your regular doctor if not improving in 3 days.  Return the emergency department if worsening. Take the pain medication as needed.  Nausea medication as needed.

## 2020-03-18 ENCOUNTER — Encounter: Payer: Self-pay | Admitting: Adult Health

## 2020-04-09 ENCOUNTER — Other Ambulatory Visit: Payer: Self-pay

## 2020-04-09 ENCOUNTER — Ambulatory Visit: Payer: Self-pay

## 2020-04-09 ENCOUNTER — Ambulatory Visit
Admission: EM | Admit: 2020-04-09 | Discharge: 2020-04-09 | Disposition: A | Discharge status: Home / Self Care | Payer: Medicaid Other | Attending: Sports Medicine | Admitting: Sports Medicine

## 2020-04-09 ENCOUNTER — Ambulatory Visit
Admission: EM | Admit: 2020-04-09 | Discharge: 2020-04-09 | Disposition: A | Discharge status: Home / Self Care | Payer: Self-pay

## 2020-04-09 ENCOUNTER — Ambulatory Visit (INDEPENDENT_AMBULATORY_CARE_PROVIDER_SITE_OTHER): Payer: Medicaid Other

## 2020-04-09 DIAGNOSIS — R079 Chest pain, unspecified: Secondary | ICD-10-CM

## 2020-04-09 DIAGNOSIS — J019 Acute sinusitis, unspecified: Secondary | ICD-10-CM

## 2020-04-09 DIAGNOSIS — R519 Headache, unspecified: Secondary | ICD-10-CM | POA: Diagnosis not present

## 2020-04-09 DIAGNOSIS — J029 Acute pharyngitis, unspecified: Secondary | ICD-10-CM | POA: Diagnosis not present

## 2020-04-09 DIAGNOSIS — R0789 Other chest pain: Secondary | ICD-10-CM

## 2020-04-09 LAB — GROUP A STREP BY PCR: Group A Strep by PCR: NOT DETECTED

## 2020-04-09 MED ORDER — DOXYCYCLINE HYCLATE 100 MG PO CAPS: 100.0000 mg | ORAL_CAPSULE | Freq: Two times a day (BID) | ORAL | 0 refills | Status: AC

## 2020-04-09 MED ORDER — LIDOCAINE VISCOUS HCL 2 % MT SOLN: 15.0000 mL | OROMUCOSAL | 0 refills | Status: AC | PRN

## 2020-04-09 NOTE — ED Triage Notes (Signed)
Pt reports having sore throat and headache x4 days. Also reports having neck pain as well. sts it is painful when swallowing. Covid+ 2 weeks ago.

## 2020-04-09 NOTE — Discharge Instructions (Signed)
Your strep test is negative.  Your chest x-[place] was normal.  No evidence of pneumonia.  The symptoms may all be post COVID and does have to run their course.  Increasing rest and fluids.  You can take Tylenol for discomfort.  I have sent viscous lidocaine for the throat.  You describe your headache it does sound like a sinus type headache.  Some concern for secondary bacterial sinus infection.  I have sent an antibiotic for this.  Take full course.  If you develop worsening chest pain, palpitations, dizziness, shortness of breath call EMS or go to ED.

## 2020-04-09 NOTE — ED Provider Notes (Signed)
[facility]    CSN: [id number] Arrival date & time: 04/09/20  1451      History   Chief Complaint Chief Complaint  Patient presents with  . Appointment  . Sore Throat    HPI Mikayla Walsh is a 45 y.o. female presenting for approximately 4-day history of sore throat, painful swallowing and headaches.  Headache radiates pain behind her eyes and into the frontal region of her head.  Patient denies recent COVID-19 exposure.  She does state that she was positive for COVID-19 about 2 weeks ago.  She denies ever having a sore throat or headache with Covid.  Patient says there was a period of a couple of days where her symptoms from Covid seem to have resolved before the swelling started.  She has not had any fever, fatigue, nasal congestion or cough.  She does report that she intermittently has some chest pains when she has headaches.  Denies any current chest pain.  No breathing difficulty or weakness.  No nausea/vomiting or diarrhea.  Patient states that she did have GI type symptoms when she was diagnosed with COVID-19 a couple weeks ago.  Patient has no other concerns today.  HPI  Past Medical History:  Diagnosis Date  . Allergy   . Cancer ([place])   . Insomnia   . Neuromuscular disorder [facility])     Patient Active Problem List   Diagnosis Date Noted  . Screening for blood or protein in urine 01/11/2020  . Urinary frequency 01/11/2020  . History of recurrent urinary tract infection 01/11/2020  . Neuromuscular disease ([place]) 01/11/2020  . 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  01/11/2020  . Vitamin D insufficiency 01/11/2020    Past Surgical History:  Procedure Laterality Date  . ABDOMINAL HYSTERECTOMY    . cervisectomy  02/2016  . CESAREAN SECTION    . TUBAL LIGATION      OB History   No obstetric history on file.      Home Medications    Prior to Admission medications   Medication Sig Start Date End Date Taking? Authorizing Provider   doxycycline (VIBRAMYCIN) 100 MG capsule Take 1 capsule (100 mg total) by mouth 2 (two) times daily for 7 days. 04/09/20 04/16/20 Yes Laurene Footman B, PA-C  lidocaine (XYLOCAINE) 2 % solution Use as directed 15 mLs in the mouth or throat every 3 (three) hours as needed for up to 7 days for mouth pain (swish and spit). 04/09/20 04/16/20 Yes Danton Clap, PA-C  clonazePAM (KLONOPIN) 0.5 MG tablet Take 1 tablet (0.5 mg total) by mouth 2 (two) times daily as needed for anxiety. 01/11/20   Flinchum, Kelby Aline, FNP  ondansetron (ZOFRAN-ODT) 4 MG disintegrating tablet Take 1 tablet (4 mg total) by mouth every 8 (eight) hours as needed. 03/14/20   Caryn Section, Linden Dolin, PA-C  OVER THE COUNTER MEDICATION Probiotic    [provider]  oxyCODONE-acetaminophen (PERCOCET) 5-325 MG tablet Take 1 tablet by mouth every 4 (four) hours as needed for severe pain. 03/14/20 03/14/21  Fisher, Linden Dolin, PA-C  Phenazopyridine HCl (PYRIDIUM PO) Take by mouth as needed.    [provider]  topiramate (TOPAMAX) 100 MG tablet Take 1 tablet (100 mg total) by mouth daily. 01/11/20   Flinchum, Kelby Aline, FNP  traZODone (DESYREL) 50 MG tablet Take 3 tablets (150 mg total) by mouth at bedtime. 01/11/20   Flinchum, Kelby Aline, FNP  Vitamin D, Ergocalciferol, (DRISDOL) 1.25 MG (50000 UNIT) CAPS capsule Take  1 capsule (50,000 Units total) by mouth every 7 (seven) days. (taking one tablet per week) walk in lab in office 1-2 weeks after completing prescription. 01/15/20   Flinchum, Kelby Aline, FNP    Family History Family History  Problem Relation Age of Onset  . Hypertension Mother   . Hypertension Father   . Hyperlipidemia Father   . Cancer Father   . Asthma Son   . Cancer Maternal Grandfather   . Kidney disease Paternal Grandmother   . Alzheimer's disease Paternal Grandfather     Social History Social History   Tobacco Use  . Smoking status: Never Smoker  . Smokeless tobacco: Never Used  Substance Use Topics  .  Alcohol use: Yes    Alcohol/week: 1.0 standard drink    Types: 1 Glasses of wine per week  . Drug use: Never     Allergies   Macrobid [nitrofurantoin], Morphine and related, Penicillins, Prednisone, and Sulfa antibiotics   Review of Systems Review of Systems  Constitutional: Negative for chills, diaphoresis, fatigue and fever.  HENT: Positive for sore throat. Negative for congestion, ear pain, rhinorrhea, sinus pressure and sinus pain.   Respiratory: Negative for cough and shortness of breath.   Cardiovascular: Positive for chest pain.  Gastrointestinal: Negative for abdominal pain, nausea and vomiting.  Musculoskeletal: Negative for arthralgias and myalgias.  Skin: Negative for rash.  Neurological: Positive for headaches. Negative for weakness.  Hematological: Negative for adenopathy.     Physical Exam Triage Vital Signs ED Triage Vitals  Enc Vitals Group     BP 04/09/20 1455 (!) 145/106     Pulse Rate 04/09/20 1455 (!) 108     Resp 04/09/20 1455 18     Temp 04/09/20 1455 98.4 F (36.9 C)     Temp Source 04/09/20 1455 Oral     SpO2 04/09/20 1455 100 %     Weight 04/09/20 1456 140 lb (63.5 kg)     Height 04/09/20 1456 5\' 9"  (1.753 m)     Head Circumference --      Peak Flow --      Pain Score 04/09/20 1456 4     Pain Loc --      Pain Edu? --      Excl. in [place]? --    No data found.  Updated Vital Signs BP (!) 128/93   Pulse (!) 110 Comment: pt sts her HR is always elevated  Temp 98.4 F (36.9 C) (Oral)   Resp 18   Ht 5\' 9"  (1.753 m)   Wt 140 lb (63.5 kg)   SpO2 100%   BMI 20.67 kg/m       Physical Exam Vitals and nursing note reviewed.  Constitutional:      General: She is not in acute distress.    Appearance: Normal appearance. She is not ill-appearing or toxic-appearing.  HENT:     Head: Normocephalic and atraumatic.     Right Ear: Tympanic membrane, ear canal and external ear normal.     Left Ear: Tympanic membrane, ear canal and external ear  normal.     Nose: Nose normal.     Mouth/Throat:     Mouth: Mucous membranes are moist.     Pharynx: Oropharynx is clear. Posterior oropharyngeal erythema present.  Eyes:     General: No scleral icterus.       Right eye: No discharge.        Left eye: No discharge.     Conjunctiva/sclera: Conjunctivae normal.  Pupils: Pupils are equal, round, and reactive to light.  Cardiovascular:     Rate and Rhythm: Regular rhythm. Tachycardia present.     Heart sounds: Normal heart sounds.  Pulmonary:     Effort: Pulmonary effort is normal. No respiratory distress.     Breath sounds: Normal breath sounds. No wheezing, rhonchi or rales.  Musculoskeletal:     Cervical back: Neck supple.  Skin:    General: Skin is dry.  Neurological:     General: No focal deficit present.     Mental Status: She is alert. Mental status is at baseline.     Motor: No weakness.     Gait: Gait normal.  Psychiatric:        Mood and Affect: Mood normal.        Behavior: Behavior normal.        Thought Content: Thought content normal.      UC Treatments / Results  Labs (all labs ordered are listed, but only abnormal results are displayed) Labs Reviewed  GROUP A STREP BY PCR    EKG   Radiology DG Chest 2 View  Result Date: 04/09/2020 CLINICAL DATA:  Sore throat and headache x4 days, COVID 2 weeks ago. EXAM: CHEST - 2 VIEW COMPARISON:  Chest radiograph December 10, 2010. FINDINGS: The heart size and mediastinal contours are within normal limits. No focal consolidation. No pleural effusion. No pneumothorax. The visualized skeletal structures are unremarkable. IMPRESSION: No active cardiopulmonary disease. Electronically Signed   By: Dahlia Bailiff MD   On: 04/09/2020 15:56    Procedures Procedures (including critical care time)  Medications Ordered in UC Medications - No data to display  Initial Impression / Assessment and Plan / UC Course  I have reviewed the triage vital signs and the nursing  notes.  Pertinent labs & imaging results that were available during my care of the patient were reviewed by me and considered in my medical decision making (see chart for details).   45 year old female presenting for sore throat, headaches and chest pain x4 days.  Patient admitting to your diagnosis of COVID-19 2 weeks ago.  Blood pressure is little elevated in the clinic today at 145/106.  Recheck is 128/93.  Pulse elevated at 107.  Recheck is 110.  Patient admits that she has tachycardia often.  I did review previous charts which does show elevated heart rates greater than 100 over several visits.  She denies any palpitations.  Molecular strep test is negative.  Chest x-Stroebel obtained today due to complaint of chest pain.  Chest x-Beightol is normal.  No evidence of pneumonia.  No red flag signs or symptoms were chest pain and not having any chest pain currently.  Advised patient the symptoms seem like post COVID symptoms and are still likely due to the virus.  Encouraged that she increase rest and fluids.  Continue OTC medications as directed.  I did send Viscous Lidocaine to pharmacy for her throat.  Some concern for possible secondary sinus infection since she says that the headaches do radiate pain behind her eyes.  I did send doxycycline to the pharmacy for certain the next couple of days if this is not improving.  Advised for her to follow-up with our clinic, PCP or ED for any worsening symptoms.  ED precautions for chest pain reviewed today.   Final Clinical Impressions(s) / UC Diagnoses   Final diagnoses:  Acute pharyngitis, unspecified etiology  Acute nonintractable headache, unspecified headache type  Chest pain, unspecified  type  Acute sinusitis, recurrence not specified, unspecified location     Discharge Instructions     Your strep test is negative.  Your chest x-[place] was normal.  No evidence of pneumonia.  The symptoms may all be post COVID and does have to run their course.   Increasing rest and fluids.  You can take Tylenol for discomfort.  I have sent viscous lidocaine for the throat.  You describe your headache it does sound like a sinus type headache.  Some concern for secondary bacterial sinus infection.  I have sent an antibiotic for this.  Take full course.  If you develop worsening chest pain, palpitations, dizziness, shortness of breath call EMS or go to ED.    ED Prescriptions    Medication Sig Dispense Auth. Provider   doxycycline (VIBRAMYCIN) 100 MG capsule Take 1 capsule (100 mg total) by mouth 2 (two) times daily for 7 days. 14 capsule Laurene Footman B, PA-C   lidocaine (XYLOCAINE) 2 % solution Use as directed 15 mLs in the mouth or throat every 3 (three) hours as needed for up to 7 days for mouth pain (swish and spit). 100 mL Danton Clap, PA-C     PDMP not reviewed this encounter.   Danton Clap, PA-C 04/09/20 [phone]

## 2020-04-11 ENCOUNTER — Emergency Department
Admission: EM | Admit: 2020-04-11 | Discharge: 2020-04-11 | Disposition: A | Discharge status: Home / Self Care | Payer: Medicaid Other | Attending: Emergency Medicine | Admitting: Emergency Medicine

## 2020-04-11 ENCOUNTER — Telehealth: Payer: Medicaid Other | Admitting: Adult Health

## 2020-04-11 ENCOUNTER — Emergency Department: Payer: Medicaid Other

## 2020-04-11 ENCOUNTER — Other Ambulatory Visit: Payer: Self-pay

## 2020-04-11 ENCOUNTER — Encounter: Payer: Self-pay | Admitting: Emergency Medicine

## 2020-04-11 DIAGNOSIS — R251 Tremor, unspecified: Secondary | ICD-10-CM | POA: Diagnosis not present

## 2020-04-11 DIAGNOSIS — Z859 Personal history of malignant neoplasm, unspecified: Secondary | ICD-10-CM | POA: Diagnosis not present

## 2020-04-11 DIAGNOSIS — R002 Palpitations: Secondary | ICD-10-CM

## 2020-04-11 DIAGNOSIS — R911 Solitary pulmonary nodule: Secondary | ICD-10-CM | POA: Diagnosis not present

## 2020-04-11 DIAGNOSIS — I2699 Other pulmonary embolism without acute cor pulmonale: Secondary | ICD-10-CM | POA: Diagnosis not present

## 2020-04-11 DIAGNOSIS — R0602 Shortness of breath: Secondary | ICD-10-CM | POA: Insufficient documentation

## 2020-04-11 DIAGNOSIS — R000 Tachycardia, unspecified: Secondary | ICD-10-CM | POA: Diagnosis not present

## 2020-04-11 DIAGNOSIS — R0789 Other chest pain: Secondary | ICD-10-CM | POA: Insufficient documentation

## 2020-04-11 DIAGNOSIS — R5383 Other fatigue: Secondary | ICD-10-CM | POA: Diagnosis not present

## 2020-04-11 DIAGNOSIS — K449 Diaphragmatic hernia without obstruction or gangrene: Secondary | ICD-10-CM | POA: Diagnosis not present

## 2020-04-11 DIAGNOSIS — R079 Chest pain, unspecified: Secondary | ICD-10-CM | POA: Diagnosis not present

## 2020-04-11 LAB — BASIC METABOLIC PANEL
Anion gap: 8 (ref 5–15)
BUN: 7 mg/dL (ref 6–20)
CO2: 25 mmol/L (ref 22–32)
Calcium: 8.9 mg/dL (ref 8.9–10.3)
Chloride: 107 mmol/L (ref 98–111)
Creatinine, Ser: 0.81 mg/dL (ref 0.44–1.00)
GFR, Estimated: >60 mL/min (ref >60)
Glucose, Bld: 98 mg/dL (ref 70–99)
Potassium: 3.7 mmol/L (ref 3.5–5.1)
Sodium: 140 mmol/L (ref 135–145)

## 2020-04-11 LAB — CBC
HCT: 46.8 % — ABNORMAL HIGH (ref 36.0–46.0)
Hemoglobin: 15.9 g/dL — ABNORMAL HIGH (ref 12.0–15.0)
MCH: 31.1 pg (ref 26.0–34.0)
MCHC: 34 g/dL (ref 30.0–36.0)
MCV: 91.4 fL (ref 80.0–100.0)
Platelets: 144 10*3/uL — ABNORMAL LOW (ref 150–400)
RBC: 5.12 MIL/uL — ABNORMAL HIGH (ref 3.87–5.11)
RDW: 12.3 % (ref 11.5–15.5)
WBC: 7.1 10*3/uL (ref 4.0–10.5)
nRBC: 0 % (ref 0.0–0.2)

## 2020-04-11 LAB — TROPONIN I (HIGH SENSITIVITY)
Troponin I (High Sensitivity): 4 ng/L (ref <18)
Troponin I (High Sensitivity): 4 ng/L (ref <18)

## 2020-04-11 MED ORDER — IOHEXOL 350 MG/ML SOLN
75.0000 mL | Freq: Once | INTRAVENOUS | Status: AC | PRN
  Administered 2020-04-11: 75 mL via INTRAVENOUS
  Filled 2020-04-11: qty 75

## 2020-04-11 NOTE — ED Provider Notes (Signed)
[facility] Emergency Department Provider Note  ____________________________________________   Event Date/Time   First MD Initiated Contact with Patient 04/11/20 1227     (approximate)  I have reviewed the triage vital signs and the nursing notes.   HISTORY  Chief Complaint Chest Pain    HPI Mikayla Walsh is a 45 y.o. female with history of prior cervical cancer, reported neuromuscular disorder, here with chest pain, shaking, and fatigue.  The patient was diagnosed with Covid at the end of February.  She had some mild diarrhea but otherwise recovered well.  She states that she woke up today with feeling of dull, pressure-like but also intermittently sharp chest pressure along with a generalized shaking that felt like it was as though she had Parkinson's.  She was notable to control this.  It was bilateral.  She felt like her heart was beating very quickly and she checked her pulse and states that it was in the 180s.  She states she had some shortness of breath with this.  Symptoms have gradually improved although she still feels somewhat "shaky."  Of note, she does have history of tremors and possible new muscular disorder and is being referred to [facility] neurology for work-up.  Denies known history of arrhythmia although she has worn multiple cardiac monitors in the past and states that her heart rate is consistently over 100 at baseline.  Denies history of DVT/PE.  No leg swelling.  No specific alleviating or aggravating factors.        Past Medical History:  Diagnosis Date  . Allergy   . Cancer ([place])   . Insomnia   . Neuromuscular disorder [facility])     Patient Active Problem List   Diagnosis Date Noted  . Screening for blood or protein in urine 01/11/2020  . Urinary frequency 01/11/2020  . History of recurrent urinary tract infection 01/11/2020  . Neuromuscular disease ([place]) 01/11/2020  . 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   01/11/2020  . Vitamin D insufficiency 01/11/2020    Past Surgical History:  Procedure Laterality Date  . ABDOMINAL HYSTERECTOMY    . cervisectomy  02/2016  . CESAREAN SECTION    . TUBAL LIGATION      Prior to Admission medications   Medication Sig Start Date End Date Taking? Authorizing Provider  clonazePAM (KLONOPIN) 0.5 MG tablet Take 1 tablet (0.5 mg total) by mouth 2 (two) times daily as needed for anxiety. 01/11/20   Flinchum, Kelby Aline, FNP  doxycycline (VIBRAMYCIN) 100 MG capsule Take 1 capsule (100 mg total) by mouth 2 (two) times daily for 7 days. 04/09/20 04/16/20  Laurene Footman B, PA-C  lidocaine (XYLOCAINE) 2 % solution Use as directed 15 mLs in the mouth or throat every 3 (three) hours as needed for up to 7 days for mouth pain (swish and spit). 04/09/20 04/16/20  Laurene Footman B, PA-C  ondansetron (ZOFRAN-ODT) 4 MG disintegrating tablet Take 1 tablet (4 mg total) by mouth every 8 (eight) hours as needed. 03/14/20   Caryn Section, Linden Dolin, PA-C  OVER THE COUNTER MEDICATION Probiotic    [provider]  oxyCODONE-acetaminophen (PERCOCET) 5-325 MG tablet Take 1 tablet by mouth every 4 (four) hours as needed for severe pain. 03/14/20 03/14/21  Fisher, Linden Dolin, PA-C  Phenazopyridine HCl (PYRIDIUM PO) Take by mouth as needed.    [provider]  topiramate (TOPAMAX) 100 MG tablet Take 1 tablet (100 mg total) by mouth daily. 01/11/20   Flinchum, Sharyn Lull  S, FNP  traZODone (DESYREL) 50 MG tablet Take 3 tablets (150 mg total) by mouth at bedtime. 01/11/20   Flinchum, Kelby Aline, FNP  Vitamin D, Ergocalciferol, (DRISDOL) 1.25 MG (50000 UNIT) CAPS capsule Take 1 capsule (50,000 Units total) by mouth every 7 (seven) days. (taking one tablet per week) walk in lab in office 1-2 weeks after completing prescription. 01/15/20   Flinchum, Kelby Aline, FNP    Allergies Macrobid [nitrofurantoin], Morphine and related, Penicillins, Prednisone, and Sulfa antibiotics  Family History  Problem  Relation Age of Onset  . Hypertension Mother   . Hypertension Father   . Hyperlipidemia Father   . Cancer Father   . Asthma Son   . Cancer Maternal Grandfather   . Kidney disease Paternal Grandmother   . Alzheimer's disease Paternal Grandfather     Social History Social History   Tobacco Use  . Smoking status: Never Smoker  . Smokeless tobacco: Never Used  Substance Use Topics  . Alcohol use: Yes    Alcohol/week: 1.0 standard drink    Types: 1 Glasses of wine per week  . Drug use: Never    Review of Systems  Review of Systems  Constitutional: Positive for fatigue. Negative for chills and fever.  HENT: Negative for sore throat.   Respiratory: Positive for chest tightness and shortness of breath.   Cardiovascular: Positive for palpitations. Negative for chest pain.  Gastrointestinal: Negative for abdominal pain.  Genitourinary: Negative for flank pain.  Musculoskeletal: Negative for neck pain.  Skin: Negative for rash and wound.  Allergic/Immunologic: Negative for immunocompromised state.  Neurological: Negative for weakness and numbness.  Hematological: Does not bruise/bleed easily.  All other systems reviewed and are negative.    ____________________________________________  PHYSICAL EXAM:      VITAL SIGNS: ED Triage Vitals  Enc Vitals Group     BP 04/11/20 1112 (!) 118/97     Pulse Rate 04/11/20 1112 98     Resp 04/11/20 1112 20     Temp 04/11/20 1112 98.5 F (36.9 C)     Temp Source 04/11/20 1112 Oral     SpO2 04/11/20 1112 98 %     Weight 04/11/20 1113 140 lb (63.5 kg)     Height 04/11/20 1113 5\' 9"  (1.753 m)     Head Circumference --      Peak Flow --      Pain Score 04/11/20 1113 4     Pain Loc --      Pain Edu? --      Excl. in [place]? --      Physical Exam Vitals and nursing note reviewed.  Constitutional:      General: She is not in acute distress.    Appearance: She is well-developed.  HENT:     Head: Normocephalic and atraumatic.  Eyes:      Conjunctiva/sclera: Conjunctivae normal.  Cardiovascular:     Rate and Rhythm: Regular rhythm. Tachycardia present.     Heart sounds: Normal heart sounds.  Pulmonary:     Effort: Pulmonary effort is normal. No respiratory distress.     Breath sounds: No wheezing.  Abdominal:     General: There is no distension.  Musculoskeletal:     Cervical back: Neck supple.  Skin:    General: Skin is warm.     Capillary Refill: Capillary refill takes less than 2 seconds.     Findings: No rash.  Neurological:     Mental Status: She is alert and oriented to  person, place, and time.     Motor: No abnormal muscle tone.       ____________________________________________   LABS (all labs ordered are listed, but only abnormal results are displayed)  Labs Reviewed  CBC - Abnormal; Notable for the following components:      Result Value   RBC 5.12 (*)    Hemoglobin 15.9 (*)    HCT 46.8 (*)    Platelets 144 (*)    All other components within normal limits  BASIC METABOLIC PANEL  TROPONIN I (HIGH SENSITIVITY)  TROPONIN I (HIGH SENSITIVITY)    ____________________________________________  EKG: Sinus tachycardia, ventricular rate 112.  PR 116, QRS 74, QTc 442.  Biatrial enlargement.  No acute ST elevations fractions. ________________________________________  RADIOLOGY All imaging, including plain films, CT scans, and ultrasounds, independently reviewed by me, and interpretations confirmed via formal radiology reads.  ED MD interpretation:   Chest x-Johnson: No acute abnormality  Official radiology report(s): DG Chest 2 View  Result Date: 04/11/2020 CLINICAL DATA:  Chest pain and lightheadedness. EXAM: CHEST - 2 VIEW COMPARISON:  Chest x-Constancio dated April 09, 2020. FINDINGS: The heart size and mediastinal contours are within normal limits. Normal pulmonary vascularity. Subtle small ovoid density over the right mid to lower lung likely represents a nipple shadow, more conspicuous compared to the  prior study from 2 days ago. No focal consolidation, pleural effusion, or pneumothorax. No acute osseous abnormality. IMPRESSION: No active cardiopulmonary disease. Electronically Signed   By: Titus Dubin M.D.   On: 04/11/2020 11:52   CT Angio Chest PE W and/or Wo Contrast  Result Date: 04/11/2020 CLINICAL DATA:  Chest pain after COVID-19 infection. Pulmonary embolism suspected. Tachycardia. EXAM: CT ANGIOGRAPHY CHEST WITH CONTRAST TECHNIQUE: Multidetector CT imaging of the chest was performed using the standard protocol during bolus administration of intravenous contrast. Multiplanar CT image reconstructions and MIPs were obtained to evaluate the vascular anatomy. CONTRAST:  48mL OMNIPAQUE IOHEXOL 350 MG/ML SOLN COMPARISON:  Radiographs 04/11/2020 and 04/09/2020. Abdominal CT 03/14/2020. FINDINGS: Cardiovascular: The pulmonary arteries are well opacified with contrast to the level of the subsegmental branches. There is no evidence of acute pulmonary embolism. No systemic arterial abnormalities are identified. The heart size is normal. There is no pericardial effusion. Mediastinum/Nodes: There are no enlarged mediastinal, hilar or axillary lymph nodes.There are mildly prominent axillary lymph nodes bilaterally which are not significantly enlarged. There is a small hiatal hernia. The thyroid gland and trachea appear unremarkable. Lungs/Pleura: No pleural effusion or pneumothorax. Scattered upper lobe lucencies in both lungs, suspicious for panlobular emphysema. There are scattered small pulmonary nodules, largest measuring 5 x 4 mm in the right lower lobe on image 46/7. There is a subpleural nodule in the right middle lobe measuring 4 mm on image 63/7, not previously imaged. No airspace disease or endobronchial lesion. Upper abdomen:  The visualized upper abdomen appears unremarkable. Musculoskeletal/Chest wall: There are small nodules in the lateral/axillary portions of both breasts, not previously imaged.  These are nonspecific and may reflect reactive lymph nodes. No other chest wall abnormality. No suspicious osseous findings. Review of the MIP images confirms the above findings. IMPRESSION: 1. No evidence of acute pulmonary embolism or other acute chest process. 2. Scattered small pulmonary nodules bilaterally, nonspecific. This appearance is almost certainly benign, and no dedicated follow-up is required if this patient is low risk for bronchogenic carcinoma (and has no known or suspected primary neoplasm). Non-contrast chest CT can be considered in 12 months if patient is high-risk. This  recommendation follows the consensus statement: Guidelines for Management of Incidental Pulmonary Nodules Detected on CT Images: From the Fleischner Society 2017; Radiology 2017; 284:228-243. 3. Nonspecific small nodules in the lateral/axillary portions of both breasts, not previously imaged. These may reflect reactive lymph nodes and are similar in size to small lymph nodes in both axilla. Recommend correlation with physical exam and previous breast imaging. If screening is not up-to-date, appropriate screening should be considered. 4. Suspected emphysema (ICD10-J43.9). Electronically Signed   By: Richardean Sale M.D.   On: 04/11/2020 14:06    ____________________________________________  PROCEDURES   Procedure(s) performed (including Critical Care):  Procedures  ____________________________________________  INITIAL IMPRESSION / MDM / [place] / ED COURSE  As part of my medical decision making, I reviewed the following data within the [facility] notes reviewed and incorporated, Old chart reviewed, Notes from prior ED visits, and [place] Controlled Substance Database       *Nimco Bivens was evaluated in Emergency Department on 04/11/2020 for the symptoms described in the history of present illness. She was evaluated in the context of the global COVID-19 pandemic, which necessitated  consideration that the patient might be at risk for infection with the SARS-CoV-2 virus that causes COVID-19. Institutional protocols and algorithms that pertain to the evaluation of patients at risk for COVID-19 are in a state of rapid change based on information released by regulatory bodies including the CDC and federal and state organizations. These policies and algorithms were followed during the patient's care in the ED.  Some ED evaluations and interventions may be delayed as a result of limited staffing during the pandemic.*     Medical Decision Making: 45 year old female here with multiple complaints after recent Covid infection.  Regarding her chest pain, tachycardia, she is in normal sinus rhythm here.  No ectopy noted on telemetry.  EKG is nonischemic.  Lab work unremarkable with negative troponins x2 and nonischemic EKG making ACS unlikely.  CT angio obtained shows likely postinfectious nodules but no apparent PE or other abnormality.  Will have her avoid stimulants, hydrate, and follow-up with the PCP for this.  Regarding her tremors, this seems to be a somewhat chronic issue although does seem worse than it was previously.  Given her history of prior neurological issues and possible autoimmune disorders, would not be surprised if she has a post-COVID exacerbation of the symptoms.  No apparent focal neuro deficits.  Do not feel emergent imaging is indicated.  She has an appointment with [place] neurology in the next month, which is reasonable.  ____________________________________________  FINAL CLINICAL IMPRESSION(S) / ED DIAGNOSES  Final diagnoses:  Atypical chest pain  Palpitations  Tremors of nervous system     MEDICATIONS GIVEN DURING THIS VISIT:  Medications  iohexol (OMNIPAQUE) 350 MG/ML injection 75 mL (75 mLs Intravenous Contrast Given 04/11/20 1327)     ED Discharge Orders    None       Note:  This document was prepared using Dragon voice recognition software and may  include unintentional dictation errors.   Duffy Bruce, MD 04/11/20 1438

## 2020-04-11 NOTE — ED Triage Notes (Addendum)
Patient to ER for c/o chest pain with tachycardia. Patient had Covid last week of February. States HR was approx 180 this am at 0700. Has h/o higher than average HR (approx 110) regularly, but feels like HR has fluctuated all morning. +Lightheadedness.

## 2020-04-11 NOTE — Discharge Instructions (Addendum)
Try to drink at least 6 to 8 glasses of water daily.  Avoid excessive caffeine, nicotine, or other stimulants.  Do not take any over-the-counter allergy or decongestant medications.  Follow-up with [facility] as we discussed.  Try to keep track of your heart rate if your symptoms continue, and record these.  This may be helpful for follow-up.

## 2020-04-11 NOTE — ED Notes (Signed)
See triage note  Presents with chest pain  States she woke up with chest pain and shaking this am  States pain is upper/mid chest  Also has been dizzy  No fever or cough

## 2020-04-30 ENCOUNTER — Ambulatory Visit: Payer: Medicaid Other | Admitting: Adult Health

## 2020-04-30 ENCOUNTER — Encounter: Payer: Self-pay | Admitting: Adult Health

## 2020-04-30 ENCOUNTER — Other Ambulatory Visit: Payer: Self-pay

## 2020-04-30 VITALS — BP 108/71 | HR 96 | Temp 98.4°F | Resp 16 | Wt 150.2 lb

## 2020-04-30 DIAGNOSIS — J439 Emphysema, unspecified: Secondary | ICD-10-CM | POA: Diagnosis not present

## 2020-04-30 DIAGNOSIS — E559 Vitamin D deficiency, unspecified: Secondary | ICD-10-CM

## 2020-04-30 DIAGNOSIS — H6502 Acute serous otitis media, left ear: Secondary | ICD-10-CM | POA: Diagnosis not present

## 2020-04-30 DIAGNOSIS — G709 Myoneural disorder, unspecified: Secondary | ICD-10-CM

## 2020-04-30 DIAGNOSIS — N6315 Unspecified lump in the right breast, overlapping quadrants: Secondary | ICD-10-CM | POA: Diagnosis not present

## 2020-04-30 DIAGNOSIS — R918 Other nonspecific abnormal finding of lung field: Secondary | ICD-10-CM | POA: Diagnosis not present

## 2020-04-30 DIAGNOSIS — J029 Acute pharyngitis, unspecified: Secondary | ICD-10-CM | POA: Diagnosis not present

## 2020-04-30 DIAGNOSIS — R911 Solitary pulmonary nodule: Secondary | ICD-10-CM

## 2020-04-30 DIAGNOSIS — R590 Localized enlarged lymph nodes: Secondary | ICD-10-CM

## 2020-04-30 DIAGNOSIS — Z8616 Personal history of COVID-19: Secondary | ICD-10-CM | POA: Diagnosis not present

## 2020-04-30 DIAGNOSIS — D582 Other hemoglobinopathies: Secondary | ICD-10-CM

## 2020-04-30 MED ORDER — ALBUTEROL SULFATE HFA 108 (90 BASE) MCG/ACT IN AERS: 2.0000 | INHALATION_SPRAY | Freq: Four times a day (QID) | RESPIRATORY_TRACT | 0 refills | Status: AC | PRN

## 2020-04-30 MED ORDER — AZITHROMYCIN 250 MG PO TABS: ORAL_TABLET | ORAL | 0 refills | Status: DC

## 2020-04-30 MED ORDER — FLUCONAZOLE 150 MG PO TABS: 150.0000 mg | ORAL_TABLET | ORAL | 0 refills | Status: DC

## 2020-04-30 NOTE — Addendum Note (Signed)
Addended by: Doreen Beam on: 04/30/2020 02:10 PM   Modules accepted: Orders

## 2020-04-30 NOTE — Patient Instructions (Signed)
Otitis Media, Adult  Otitis media is a condition in which the middle ear is red and swollen (inflamed) and full of fluid. The middle ear is the part of the ear that contains bones for hearing as well as air that helps send sounds to the brain. The condition usually goes away on its own. What are the causes? This condition is caused by a blockage in the eustachian tube. The eustachian tube connects the middle ear to the back of the nose. It normally allows air into the middle ear. The blockage is caused by fluid or swelling. Problems that can cause blockage include:  A cold or infection that affects the nose, mouth, or throat.  Allergies.  An irritant, such as tobacco smoke.  Adenoids that have become large. The adenoids are soft tissue located in the back of the throat, behind the nose and the roof of the mouth.  Growth or swelling in the upper part of the throat, just behind the nose (nasopharynx).  Damage to the ear caused by change in pressure. This is called barotrauma. What are the signs or symptoms? Symptoms of this condition include:  Ear pain.  Fever.  Problems with hearing.  Being tired.  Fluid leaking from the ear.  Ringing in the ear. How is this treated? This condition can go away on its own within 3-5 days. But if the condition is caused by bacteria or does not go away on its own, or if it keeps coming back, your doctor may:  Give you antibiotic medicines.  Give you medicines for pain. Follow these instructions at home:  Take over-the-counter and prescription medicines only as told by your doctor.  If you were prescribed an antibiotic medicine, take it as told by your doctor. Do not stop taking the antibiotic even if you start to feel better.  Keep all follow-up visits as told by your doctor. This is important. Contact a doctor if:  You have bleeding from your nose.  There is a lump on your neck.  You are not feeling better in 5 days.  You feel worse  instead of better. Get help right away if:  You have pain that is not helped with medicine.  You have swelling, redness, or pain around your ear.  You get a stiff neck.  You cannot move part of your face (paralysis).  You notice that the bone behind your ear hurts when you touch it.  You get a very bad headache. Summary  Otitis media means that the middle ear is red, swollen, and full of fluid.  This condition usually goes away on its own.  If the problem does not go away, treatment may be needed. You may be given medicines to treat the infection or to treat your pain.  If you were prescribed an antibiotic medicine, take it as told by your doctor. Do not stop taking the antibiotic even if you start to feel better.  Keep all follow-up visits as told by your doctor. This is important. This information is not intended to replace advice given to you by your health care provider. Make sure you discuss any questions you have with your health care provider. Document Revised: 12/21/2018 Document Reviewed: 12/21/2018 Elsevier Patient Education  2021 [place] A mammogram is an X-[place]ard of the breasts. This is done to check for changes that are not normal. This test can look for changes that may be caused by breast cancer or other problems. Mammograms are regularly done on women  beginning at age 44. A man may have a mammogram if he has a lump or swelling in his breast. Tell a doctor:  About any allergies you have.  If you have breast implants.  If you have had breast disease, biopsy, or surgery.  If you have a family history of breast cancer.  If you are breastfeeding.  Whether you are pregnant or may be pregnant. What are the risks? Generally, this is a safe procedure. But problems may occur, including:  Being exposed to radiation. Radiation levels are very low with this test.  The need for more tests.  The results were not read properly.  Trouble finding  breast cancer in women with dense breasts. What happens before the test?  Have this test done about 1-2 weeks after your menstrual period. This is often when your breasts are the least tender.  If you are visiting a new doctor or clinic, have any past mammogram images sent to your new doctor's office.  Wash your breasts and under your arms on the day of the test.  Do not use deodorants, perfumes, lotions, or powders on the day of the test.  Take off any jewelry from your neck.  Wear clothes that you can change into and out of easily. What happens during the test?  You will take off your clothes from the waist up. You will put on a gown.  You will stand in front of the X-Levesque machine.  Each breast will be placed between two plastic or glass plates. The plates will press down on your breast for a few seconds. Try to relax. This does not cause any harm to your breasts. It may not feel comfortable, but it will be very brief.  X-rays will be taken from different angles of each breast. The procedure may vary among doctors and hospitals.   What can I expect after the test?  The mammogram will be read by a specialist (radiologist).  You may need to do parts of the test again. This depends on the quality of the images.  You may go back to your normal activities.  It is up to you to get the results of your test. Ask how to get your results when they are ready. Summary  A mammogram is an X-[place] of the breasts. It looks for changes that may be caused by breast cancer or other problems.  A man may have this test if he has a lump or swelling in his breast.  Before the test, tell your doctor about any breast problems that you have had in the past.  Have this test done about 1-2 weeks after your menstrual period.  Ask when your test results will be ready. Make sure you get your test results. This information is not intended to replace advice given to you by your health care provider. Make  sure you discuss any questions you have with your health care provider. Document Revised: 11/19/2019 Document Reviewed: 11/19/2019 Elsevier Patient Education  2021 [employer].

## 2020-04-30 NOTE — Progress Notes (Addendum)
Established patient visit   Patient: Mikayla Walsh   DOB: 30-Jun-1975   45 y.o. Female  MRN: [id number] Visit Date: 04/30/2020  Today's healthcare provider: Marcille Buffy, FNP   Chief Complaint  Patient presents with  . Follow-up  . Rash   Subjective    Rash This is a new problem. The current episode started in the past 7 days. The affected locations include the chest and back. The rash is characterized by redness. She was exposed to nothing. Pertinent negatives include no anorexia, congestion, cough, diarrhea, eye pain, facial edema, fatigue, fever, joint pain, nail changes, rhinorrhea, shortness of breath, sore throat or vomiting. Past treatments include nothing. There is no history of eczema.   is improving, has had sore throat since she left emergency room.   Follow up ER visit  Patient was seen in ER for atypical chest pain on 04/11/20. She was treated for atypical chest pain, palpitations and tremors of nervous system. Treatment for this included administering Iohexol 375m/75mL intravenous. She reports good compliance with treatment. She reports this condition is Improved.   Smoking on and off. Tobacco use.   Seeing [facility] neurology. Has had muscular tests.  She has constant body aches. Denies any tick bites.    She had stress test and cardiac work up 4 years ago that was negative.   EKG-  Sinus tachycardia Biatrial enlargement Abnormal ECG  Never had ECHO.   Patient  denies any ,chills, rash, chest pain, nausea, vomiting, or diarrhea.  Denies dizziness, lightheadedness, pre syncopal or syncopal episodes.   ----------------------------------------------------------------------------------------- CLINICAL DATA:  Chest pain after COVID-19 infection. Pulmonary embolism suspected. Tachycardia.  EXAM: CT ANGIOGRAPHY CHEST WITH CONTRAST  TECHNIQUE: Multidetector CT imaging of the chest was performed using the standard protocol during bolus  administration of intravenous contrast. Multiplanar CT image reconstructions and MIPs were obtained to evaluate the vascular anatomy.  CONTRAST:  762mOMNIPAQUE IOHEXOL 350 MG/ML SOLN  COMPARISON:  Radiographs 04/11/2020 and 04/09/2020. Abdominal CT 03/14/2020.  FINDINGS: Cardiovascular: The pulmonary arteries are well opacified with contrast to the level of the subsegmental branches. There is no evidence of acute pulmonary embolism. No systemic arterial abnormalities are identified. The heart size is normal. There is no pericardial effusion.  Mediastinum/Nodes: There are no enlarged mediastinal, hilar or axillary lymph nodes.There are mildly prominent axillary lymph nodes bilaterally which are not significantly enlarged. There is a small hiatal hernia. The thyroid gland and trachea appear unremarkable.  Lungs/Pleura: No pleural effusion or pneumothorax. Scattered upper lobe lucencies in both lungs, suspicious for panlobular emphysema. There are scattered small pulmonary nodules, largest measuring 5 x 4 mm in the right lower lobe on image 46/7. There is a subpleural nodule in the right middle lobe measuring 4 mm on image 63/7, not previously imaged. No airspace disease or endobronchial lesion.  Upper abdomen:  The visualized upper abdomen appears unremarkable.  Musculoskeletal/Chest wall: There are small nodules in the lateral/axillary portions of both breasts, not previously imaged. These are nonspecific and may reflect reactive lymph nodes. No other chest wall abnormality. No suspicious osseous findings.  Review of the MIP images confirms the above findings.  IMPRESSION: 1. No evidence of acute pulmonary embolism or other acute chest process. 2. Scattered small pulmonary nodules bilaterally, nonspecific. This appearance is almost certainly benign, and no dedicated follow-up is required if this patient is low risk for bronchogenic carcinoma (and has no known or  suspected primary neoplasm). Non-contrast chest CT can be considered in  12 months if patient is high-risk. This recommendation follows the consensus statement: Guidelines for Management of Incidental Pulmonary Nodules Detected on CT Images: From the Fleischner Society 2017; Radiology 2017; 284:228-243. 3. Nonspecific small nodules in the lateral/axillary portions of both breasts, not previously imaged. These may reflect reactive lymph nodes and are similar in size to small lymph nodes in both axilla. Recommend correlation with physical exam and previous breast imaging. If screening is not up-to-date, appropriate screening should be considered. 4. Suspected emphysema (ICD10-J43.9).   Electronically Signed   By: Richardean Sale M.D.   On: 04/11/2020 14:06   Patient Active Problem List   Diagnosis Date Noted  . Axillary lymphadenopathy 04/30/2020  . Elevated hemoglobin ([place]) 04/30/2020  . History of COVID-19 04/30/2020  . Non-recurrent acute serous otitis media of left ear 04/30/2020  . Abnormal CT lung screening 04/30/2020  . Pulmonary emphysema ([place]) 04/30/2020  . Pulmonary nodule 04/30/2020  . Screening for blood or protein in urine 01/11/2020  . Urinary frequency 01/11/2020  . History of recurrent urinary tract infection 01/11/2020  . Neuromuscular disease ([place]) 01/11/2020  . 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  01/11/2020  . Vitamin D insufficiency 01/11/2020   Past Medical History:  Diagnosis Date  . Allergy   . Cancer ([place])   . Insomnia   . Neuromuscular disorder (HCC)    Allergies  Allergen Reactions  . Cefdinir   . Latex   . Macrobid [Nitrofurantoin] Other (See Comments)    Chest pain  . Morphine And Related Hives and Swelling  . Penicillins Hives    Redness, itching  . Prednisone Hives and Swelling  . Sulfa Antibiotics Hives    Itching and redness       Medications: Outpatient Medications Prior to Visit  Medication Sig   . clonazePAM (KLONOPIN) 0.5 MG tablet Take 1 tablet (0.5 mg total) by mouth 2 (two) times daily as needed for anxiety.  Marland Kitchen OVER THE COUNTER MEDICATION Probiotic  . Phenazopyridine HCl (PYRIDIUM PO) Take by mouth as needed.  . topiramate (TOPAMAX) 100 MG tablet Take 1 tablet (100 mg total) by mouth daily.  . traZODone (DESYREL) 50 MG tablet Take 3 tablets (150 mg total) by mouth at bedtime.  . Vitamin D, Ergocalciferol, (DRISDOL) 1.25 MG (50000 UNIT) CAPS capsule Take 1 capsule (50,000 Units total) by mouth every 7 (seven) days. (taking one tablet per week) walk in lab in office 1-2 weeks after completing prescription.  . [DISCONTINUED] ondansetron (ZOFRAN-ODT) 4 MG disintegrating tablet Take 1 tablet (4 mg total) by mouth every 8 (eight) hours as needed.  . [DISCONTINUED] oxyCODONE-acetaminophen (PERCOCET) 5-325 MG tablet Take 1 tablet by mouth every 4 (four) hours as needed for severe pain.   No facility-administered medications prior to visit.    Review of Systems  Constitutional: Negative for fatigue and fever.  HENT: Negative for congestion, rhinorrhea and sore throat.   Eyes: Negative for pain.  Respiratory: Negative for cough and shortness of breath.   Gastrointestinal: Negative for anorexia, diarrhea and vomiting.  Musculoskeletal: Negative for joint pain.  Skin: Positive for rash. Negative for nail changes.    Last CBC Lab Results  Component Value Date   WBC 7.1 04/11/2020   HGB 15.9 (H) 04/11/2020   HCT 46.8 (H) 04/11/2020   MCV 91.4 04/11/2020   MCH 31.1 04/11/2020   RDW 12.3 04/11/2020   PLT 144 (L) 38/25/0539   Last metabolic panel Lab Results  Component Value Date   GLUCOSE  98 04/11/2020   NA 140 04/11/2020   K 3.7 04/11/2020   CL 107 04/11/2020   CO2 25 04/11/2020   BUN 7 04/11/2020   CREATININE 0.81 04/11/2020   GFRNONAA >60 04/11/2020   GFRAA 96 01/11/2020   CALCIUM 8.9 04/11/2020   PROT 8.5 (H) 03/14/2020   ALBUMIN 4.9 03/14/2020   LABGLOB 2.7  01/11/2020   AGRATIO 1.7 01/11/2020   BILITOT 0.5 03/14/2020   ALKPHOS 84 03/14/2020   AST 18 03/14/2020   ALT 17 03/14/2020   ANIONGAP 8 04/11/2020   Last lipids Lab Results  Component Value Date   CHOL 232 (H) 01/11/2020   HDL 35 (L) 01/11/2020   LDLCALC 150 (H) 01/11/2020   TRIG 257 (H) 01/11/2020   CHOLHDL 6.6 (H) 01/11/2020   Last hemoglobin A1c No results found for: HGBA1C Last thyroid functions Lab Results  Component Value Date   TSH 0.844 01/11/2020   Last vitamin D Lab Results  Component Value Date   VD25OH 25.4 (L) 01/11/2020   Last vitamin B12 and Folate No results found for: VITAMINB12, FOLATE     Objective    BP 108/71   Pulse 96   Temp 98.4 F (36.9 C) (Oral)   Resp 16   Wt 150 lb 3.2 oz (68.1 kg)   SpO2 99%   BMI 22.18 kg/m  BP Readings from Last 3 Encounters:  04/30/20 108/71  04/11/20 120/88  04/09/20 (!) 128/93   Wt Readings from Last 3 Encounters:  04/30/20 150 lb 3.2 oz (68.1 kg)  04/11/20 140 lb (63.5 kg)  04/09/20 140 lb (63.5 kg)       Physical Exam Constitutional:      General: She is not in acute distress.    Appearance: Normal appearance. She is not ill-appearing, toxic-appearing or diaphoretic.     Comments: Patient is alert and oriented and responsive to questions Engages in eye contact with provider. Speaks in full sentences without any pauses without any shortness of breath or distress.    HENT:     Head: Normocephalic and atraumatic.     Jaw: There is normal jaw occlusion.     Right Ear: Tympanic membrane, ear canal and external ear normal. There is no impacted cerumen. Tympanic membrane is not erythematous.     Left Ear: Hearing and external ear normal. A middle ear effusion is present. Tympanic membrane is erythematous.     Nose: Nose normal. No congestion or rhinorrhea.     Right Sinus: No frontal sinus tenderness.     Left Sinus: No maxillary sinus tenderness or frontal sinus tenderness.     Mouth/Throat:      Lips: Pink.     Mouth: Mucous membranes are moist.     Tongue: No lesions.     Pharynx: Uvula midline. Oropharyngeal exudate and posterior oropharyngeal erythema present. No pharyngeal swelling or uvula swelling.     Tonsils: No tonsillar exudate or tonsillar abscesses.  Eyes:     General: No scleral icterus.       Right eye: No discharge.        Left eye: No discharge.     Extraocular Movements: Extraocular movements intact.     Pupils: Pupils are equal, round, and reactive to light.  Cardiovascular:     Rate and Rhythm: Normal rate and regular rhythm.     Pulses: Normal pulses.     Heart sounds: Normal heart sounds. No murmur heard. No friction rub. No gallop.   Pulmonary:  Effort: Pulmonary effort is normal. No respiratory distress.     Breath sounds: Normal breath sounds. No stridor. No wheezing, rhonchi or rales.  Chest:     Chest wall: No tenderness.  Breasts:     Right: Mass and axillary adenopathy present. No nipple discharge, tenderness or supraclavicular adenopathy.     Left: Axillary adenopathy present. No mass, nipple discharge, tenderness or supraclavicular adenopathy. Inverted nipple: non tender.      Abdominal:     General: There is no distension.     Palpations: Abdomen is soft.     Tenderness: There is no abdominal tenderness. There is no guarding.  Musculoskeletal:     Cervical back: Normal range of motion.  Lymphadenopathy:     Head:     Right side of head: No submental, submandibular, tonsillar, preauricular, posterior auricular or occipital adenopathy.     Left side of head: No submental, submandibular, tonsillar, preauricular, posterior auricular or occipital adenopathy.     Cervical: Cervical adenopathy present.     Right cervical: Superficial cervical adenopathy present. No deep or posterior cervical adenopathy.    Left cervical: Superficial cervical adenopathy present. No deep or posterior cervical adenopathy.     Upper Body:     Right upper body:  Axillary adenopathy present. No supraclavicular adenopathy.     Left upper body: Axillary adenopathy present. No supraclavicular adenopathy.  Skin:    General: Skin is warm.     Findings: Rash (truncalanterior and posterior  rash seems to be fading. ) present.  Neurological:     Mental Status: She is alert and oriented to person, place, and time.     Cranial Nerves: No cranial nerve deficit.     Sensory: No sensory deficit.     Motor: No weakness.     Coordination: Coordination normal.     Gait: Gait normal.     Deep Tendon Reflexes: Reflexes normal.  Psychiatric:        Mood and Affect: Mood normal.        Behavior: Behavior normal.        Thought Content: Thought content normal.        Judgment: Judgment normal.      No results found for any visits on 04/30/20.  Assessment & Plan      1. Neuromuscular disease ([place]) Keep follow up at [facility].  - CBC with Differential/Platelet - Comprehensive Metabolic Panel (CMET) - TSH - Ambulatory referral to Hematology - Sed Rate (ESR) - Rheumatoid factor - ANA Direct w/Reflex if Positive  2. Axillary lymphadenopathy [facility] imaging ordered.  - MM Digital Diagnostic Bilat - US BREAST COMPLETE UNI LEFT INC AXILLA - US BREAST COMPLETE UNI RIGHT INC AXILLA - Ambulatory referral to Hematology  3. Elevated hemoglobin ([place]) Will have hematology evaluate for hemochromatosis.  Also questionable autoimmune - declines rheumatology at this time.   - MM Digital Diagnostic Bilat - US BREAST COMPLETE UNI LEFT INC AXILLA - US BREAST COMPLETE UNI RIGHT INC AXILLA - Ambulatory referral to Hematology  4. History of COVID-19 Has some residual shortness of breath, son has inhalers at home she has used.  Precautions and use discussed. Return precautions discussed.  - albuterol (VENTOLIN HFA) 108 (90 Base) MCG/ACT inhaler; Inhale 2 puffs into the lungs every 6 (six) hours as needed for wheezing or shortness of breath.  Dispense: 8 g;  Refill: 0  5. Vitamin D insufficiency  - VITAMIN D 25 Hydroxy (Vit-D Deficiency, Fractures)  6. Non-recurrent  acute serous otitis media of left ear Questionable strep throat. Allergy to multiple antibiotics requests diflucan as well.  - azithromycin (ZITHROMAX) 250 MG tablet; By mouth Take 2 tablets day 1 (562m total) and 1 tablet ( 250 mg ) on days 2,3,4,5.  Dispense: 6 tablet; Refill: 0 - fluconazole (DIFLUCAN) 150 MG tablet; Take 1 tablet (150 mg total) by mouth as directed. Take one tablet by mouth on day 1. May repeat dose of one tablet by mouth on day four.  Dispense: 2 tablet; Refill: 0  7. Pulmonary nodule referral to pulmonary patient declined at this time, CT reports likely benign however she does smoke.   8. Pulmonary emphysema, unspecified [place]sema type [place] referral to pulmonary patient declined at this time, CT reports likely benign however she does smoke.   9. Abnormal CT lung screening referral to pulmonary patient declined at this time, CT reports likely benign however she does smoke.   10. Sorethroat unable to culture in office will cover strep pharyngitis.  - Mononucleosis Test, Qual W/ Reflex  History of abnormal EKG declined cardiolgy refferal at this time. See HPI had previous work up over 4 years ago would advise to see them again, declined at this time.   Return in about 3 weeks (around 05/21/2020), or if symptoms worsen or fail to improve, for at any time for any worsening symptoms, Go to Emergency room/ urgent care if worse.     The entirety of the information documented in the History of Present Illness, Review of Systems and Physical Exam were personally obtained by me. Portions of this information were initially documented by the CMA and reviewed by me for thoroughness and accuracy.      MMarcille Buffy [phone](phone) [phone](fax)  [place]

## 2020-05-09 ENCOUNTER — Encounter: Payer: Self-pay | Admitting: Internal Medicine

## 2020-05-09 ENCOUNTER — Inpatient Hospital Stay: Payer: Medicaid Other

## 2020-05-09 ENCOUNTER — Inpatient Hospital Stay: Payer: Medicaid Other | Attending: Internal Medicine | Admitting: Internal Medicine

## 2020-05-09 ENCOUNTER — Other Ambulatory Visit: Payer: Self-pay

## 2020-05-09 DIAGNOSIS — Z8616 Personal history of COVID-19: Secondary | ICD-10-CM | POA: Diagnosis not present

## 2020-05-09 DIAGNOSIS — D751 Secondary polycythemia: Secondary | ICD-10-CM | POA: Insufficient documentation

## 2020-05-09 DIAGNOSIS — M2550 Pain in unspecified joint: Secondary | ICD-10-CM | POA: Insufficient documentation

## 2020-05-09 DIAGNOSIS — Z79899 Other long term (current) drug therapy: Secondary | ICD-10-CM | POA: Diagnosis not present

## 2020-05-09 DIAGNOSIS — R5383 Other fatigue: Secondary | ICD-10-CM | POA: Diagnosis not present

## 2020-05-09 DIAGNOSIS — R531 Weakness: Secondary | ICD-10-CM | POA: Diagnosis not present

## 2020-05-09 DIAGNOSIS — G4700 Insomnia, unspecified: Secondary | ICD-10-CM | POA: Insufficient documentation

## 2020-05-09 DIAGNOSIS — Z801 Family history of malignant neoplasm of trachea, bronchus and lung: Secondary | ICD-10-CM | POA: Diagnosis not present

## 2020-05-09 DIAGNOSIS — M797 Fibromyalgia: Secondary | ICD-10-CM | POA: Diagnosis not present

## 2020-05-09 LAB — LACTATE DEHYDROGENASE: LDH: 101 U/L (ref 98–192)

## 2020-05-09 LAB — SEDIMENTATION RATE: Sed Rate: 7 mm/hr (ref 0–20)

## 2020-05-09 LAB — VITAMIN D 25 HYDROXY (VIT D DEFICIENCY, FRACTURES): Vit D, 25-Hydroxy: 34.68 ng/mL (ref 30–100)

## 2020-05-09 NOTE — Progress Notes (Signed)
[facility] CONSULT NOTE  Patient Care Team: Flinchum, Kelby Aline, FNP as PCP - General (Family Medicine)  CHIEF COMPLAINTS/PURPOSE OF CONSULTATION: ERYTHROCYTOSIS   HEMATOLOGY HISTORY  # ERYTHROCYTOSIS  [Hb; WBC; platelets]  # TAH [s/p cervical cancer]  HISTORY OF PRESENTING ILLNESS:  Mikayla Walsh 45 y.o.  female pleasant patient was been referred to [place] for further evaluation of erythrocytosis incidentally noted on routine blood work.  Patient has chronic joint pains [? Paraneoplastic as per pt]-she is currently awaiting work-up with [facility] neurology.   Also interestingly patient had a recent CT scan for follow-up of COVID infection.  Incidentally bilateral benign-appearing lymph nodes noted in the axillary tail.  Patient denies any history of breast disease.  Smoking:on  And off- trying to quit [25 year ] Lung Disease/COPD: none OSA/CPAP:none Hx of DVT/PE or Stroke:  none    Review of Systems  Constitutional: Positive for malaise/fatigue. Negative for chills, diaphoresis, fever and weight loss.  HENT: Negative for nosebleeds and sore throat.   Eyes: Negative for double vision.  Respiratory: Negative for cough, hemoptysis, sputum production, shortness of breath and wheezing.   Cardiovascular: Negative for chest pain, palpitations, orthopnea and leg swelling.  Gastrointestinal: Negative for abdominal pain, blood in stool, constipation, diarrhea, heartburn, melena, nausea and vomiting.  Genitourinary: Negative for dysuria, frequency and urgency.  Musculoskeletal: Positive for back pain, joint pain and myalgias.  Skin: Negative.  Negative for itching and rash.  Neurological: Negative for dizziness, tingling, focal weakness, weakness and headaches.  Endo/Heme/Allergies: Does not bruise/bleed easily.  Psychiatric/Behavioral: Negative for depression. The patient is not nervous/anxious and does not have insomnia.      MEDICAL HISTORY:  Past Medical History:   Diagnosis Date  . Allergy   . Cancer ([place])   . Insomnia   . Neuromuscular disorder [facility])     SURGICAL HISTORY: Past Surgical History:  Procedure Laterality Date  . ABDOMINAL HYSTERECTOMY    . cervisectomy  02/2016  . CESAREAN SECTION    . TUBAL LIGATION      SOCIAL HISTORY: Social History   Socioeconomic History  . Marital status: Divorced    Spouse name: Not on file  . Number of children: Not on file  . Years of education: Not on file  . Highest education level: Not on file  Occupational History  . Not on file  Tobacco Use  . Smoking status: Never Smoker  . Smokeless tobacco: Never Used  Substance and Sexual Activity  . Alcohol use: Yes    Alcohol/week: 1.0 standard drink    Types: 1 Glasses of wine per week  . Drug use: Never  . Sexual activity: Yes  Other Topics Concern  . Not on file  Social History Narrative   Lives in [place]; at home- 4 kids [9 to 22]; light smoker; no alcohol.    Social Determinants of Health   Financial Resource Strain: Not on file  Food Insecurity: Not on file  Transportation Needs: Not on file  Physical Activity: Not on file  Stress: Not on file  Social Connections: Not on file  Intimate Partner Violence: Not on file    FAMILY HISTORY: Family History  Problem Relation Age of Onset  . Hypertension Mother   . Hypertension Father   . Hyperlipidemia Father   . Cancer Father        lung cancer  . Asthma Son   . Cancer Maternal Grandfather   . Kidney disease Paternal Grandmother   . Alzheimer's  disease Paternal Grandfather     ALLERGIES:  is allergic to cefdinir, latex, macrobid [nitrofurantoin], morphine and related, penicillins, prednisone, and sulfa antibiotics.  MEDICATIONS:  Current Outpatient Medications  Medication Sig Dispense Refill  . albuterol (VENTOLIN HFA) 108 (90 Base) MCG/ACT inhaler Inhale 2 puffs into the lungs every 6 (six) hours as needed for wheezing or shortness of breath. 8 g 0  . azithromycin  (ZITHROMAX) 250 MG tablet By mouth Take 2 tablets day 1 (500mg  total) and 1 tablet ( 250 mg ) on days 2,3,4,5. 6 tablet 0  . fluconazole (DIFLUCAN) 150 MG tablet Take 1 tablet (150 mg total) by mouth as directed. Take one tablet by mouth on day 1. May repeat dose of one tablet by mouth on day four. 2 tablet 0  . OVER THE COUNTER MEDICATION Probiotic    . Phenazopyridine HCl (PYRIDIUM PO) Take by mouth as needed.    . topiramate (TOPAMAX) 100 MG tablet Take 1 tablet (100 mg total) by mouth daily. 30 tablet 1  . traZODone (DESYREL) 50 MG tablet Take 3 tablets (150 mg total) by mouth at bedtime. 90 tablet 0  . Vitamin D, Ergocalciferol, (DRISDOL) 1.25 MG (50000 UNIT) CAPS capsule Take 1 capsule (50,000 Units total) by mouth every 7 (seven) days. (taking one tablet per week) walk in lab in office 1-2 weeks after completing prescription. 12 capsule 0  . clonazePAM (KLONOPIN) 0.5 MG tablet Take 1 tablet (0.5 mg total) by mouth 2 (two) times daily as needed for anxiety. (Patient not taking: Reported on 05/09/2020) 90 tablet 0   No current facility-administered medications for this visit.     Marland Kitchen  PHYSICAL EXAMINATION:   Vitals:   05/09/20 1337  BP: 120/89  Pulse: 94  Resp: 16  Temp: 97.8 F (36.6 C)  SpO2: 100%   Filed Weights   05/09/20 1337  Weight: 148 lb 12.8 oz (67.5 kg)    Physical Exam HENT:     Head: Normocephalic and atraumatic.     Mouth/Throat:     Pharynx: No oropharyngeal exudate.  Eyes:     Pupils: Pupils are equal, round, and reactive to light.  Cardiovascular:     Rate and Rhythm: Normal rate and regular rhythm.  Pulmonary:     Effort: No respiratory distress.     Breath sounds: No wheezing.  Abdominal:     General: Bowel sounds are normal. There is no distension.     Palpations: Abdomen is soft. There is no mass.     Tenderness: There is no abdominal tenderness. There is no guarding or rebound.  Musculoskeletal:        General: No tenderness. Normal range of  motion.     Cervical back: Normal range of motion and neck supple.  Skin:    General: Skin is warm.     Comments: Right and left BREAST exam (in the presence of nurse)- no unusual skin changes or dominant masses felt.   Neurological:     Mental Status: She is alert and oriented to person, place, and time.  Psychiatric:        Mood and Affect: Affect normal.      LABORATORY DATA:  I have reviewed the data as listed Lab Results  Component Value Date   WBC 7.1 04/11/2020   HGB 15.9 (H) 04/11/2020   HCT 46.8 (H) 04/11/2020   MCV 91.4 04/11/2020   PLT 144 (L) 04/11/2020   Recent Labs    01/11/20 1053 03/14/20 1841  04/11/20 1116  NA 141 139 140  K 4.5 3.4* 3.7  CL 104 104 107  CO2 21 24 25   GLUCOSE 94 102* 98  BUN 6 7 7   CREATININE 0.86 0.70 0.81  CALCIUM 9.6 9.5 8.9  GFRNONAA 83 >60 >60  GFRAA 96  --   --   PROT 7.2 8.5*  --   ALBUMIN 4.5 4.9  --   AST 11 18  --   ALT 12 17  --   ALKPHOS 102 84  --   BILITOT 0.3 0.5  --      No results found.  ASSESSMENT & PLAN:   Erythrocytosis # ERYTHROCYTOSIS: Hemoglobin 15.9 hematocrit 46.8.  Appears asymptomatic [see fatigue-likely other causes].  I had a long discussion the patient regarding the potential causes of her abnormal blood counts-both primary bone marrow problem versus reactive/secondary causes.  Clinically suspect secondary causes.  # For now I  recommend checking CBC;CMP jak 2; LDH on the peripheral blood.   #Incidental on CT scan chest [march 2022; COVID f/u] -nonspecific small nodules in the lateral/axillary portions of both breasts, not previously imaged. These may reflect reactive lymph nodes and are similar in size to small lymph nodes in both axilla.  Physical exam negative for any obvious adenopathy.  Await mammogram as ordered.  #Chronic fatigue-unlikely related to #1  #Chronic joint pains myalgias awaiting evaluation at [facility].  Thank you Ms. Flinchum for allowing me to participate in the care of your  pleasant patient. Please do not hesitate to contact me with questions or concerns in the interim.  DISPOSITION: # lasb today # follow up TBD-Dr.B  All questions were answered. The patient knows to call the clinic with any problems, questions or concerns.  Thank you Dr. for allowing me to participate in the care of your pleasant patient. Please do not hesitate to contact me with questions or concerns in the interim.   Cammie Sickle, MD 05/20/2020 12:55 PM  # 45 minutes face-to-face with the patient discussing the above plan of care; more than 50% of time spent on counseling and coordination.

## 2020-05-09 NOTE — Assessment & Plan Note (Addendum)
#   ERYTHROCYTOSIS: Hemoglobin 15.9 hematocrit 46.8.  Appears asymptomatic [see fatigue-likely other causes].  I had a long discussion the patient regarding the potential causes of her abnormal blood counts-both primary bone marrow problem versus reactive/secondary causes.  Clinically suspect secondary causes.  # For now I  recommend checking CBC;CMP jak 2; LDH on the peripheral blood.   #Incidental on CT scan chest [march 2022; COVID f/u] -nonspecific small nodules in the lateral/axillary portions of both breasts, not previously imaged. These may reflect reactive lymph nodes and are similar in size to small lymph nodes in both axilla.  Physical exam negative for any obvious adenopathy.  Await mammogram as ordered.  #Chronic fatigue-unlikely related to #1  #Chronic joint pains myalgias awaiting evaluation at [facility].  Thank you Ms. Flinchum for allowing me to participate in the care of your pleasant patient. Please do not hesitate to contact me with questions or concerns in the interim.  DISPOSITION: # lasb today # follow up TBD-Dr.B

## 2020-05-10 LAB — ANA W/REFLEX IF POSITIVE: Anti Nuclear Antibody (ANA): NEGATIVE

## 2020-05-10 LAB — ERYTHROPOIETIN: Erythropoietin: 14.1 m[IU]/mL (ref 2.6–18.5)

## 2020-05-12 NOTE — Progress Notes (Signed)
Vitamin D is now within normal limits, recommend over the counter vitamin D3 at 4,000 international units by mouth once daily.  ANA and RA factor are negative.

## 2020-05-22 LAB — JAK2  V617F QUAL. WITH REFLEX TO EXON 12: Reflex:: 15

## 2020-05-22 LAB — JAK2 EXONS 12-15

## 2020-05-26 ENCOUNTER — Telehealth: Payer: Self-pay | Admitting: Internal Medicine

## 2020-05-26 NOTE — Telephone Encounter (Signed)
On 4/22-I spoke with patient regarding results of the JAK2 testing-negative.  Suspect secondary causes.  Discussed regarding bone marrow biopsy however would not recommend bone marrow biopsy.  I would recommend blood donation every few months.  Otherwise no further follow-up is recommended.  Patient will continue follow-up with PCP as planned  FYI-Ms.Flinchum FNP.    

## 2020-05-30 ENCOUNTER — Ambulatory Visit
Admission: RE | Admit: 2020-05-30 | Discharge: 2020-05-30 | Disposition: A | Discharge status: Home / Self Care | Payer: Medicaid Other | Source: Ambulatory Visit | Attending: Adult Health | Admitting: Adult Health

## 2020-05-30 ENCOUNTER — Other Ambulatory Visit: Payer: Self-pay

## 2020-05-30 ENCOUNTER — Encounter: Payer: Self-pay | Admitting: Adult Health

## 2020-05-30 DIAGNOSIS — R918 Other nonspecific abnormal finding of lung field: Secondary | ICD-10-CM | POA: Diagnosis not present

## 2020-05-30 DIAGNOSIS — D360 Benign neoplasm of lymph nodes: Secondary | ICD-10-CM | POA: Diagnosis not present

## 2020-05-30 DIAGNOSIS — R590 Localized enlarged lymph nodes: Secondary | ICD-10-CM | POA: Diagnosis not present

## 2020-05-30 DIAGNOSIS — R922 Inconclusive mammogram: Secondary | ICD-10-CM | POA: Diagnosis not present

## 2020-05-30 DIAGNOSIS — M62838 Other muscle spasm: Secondary | ICD-10-CM | POA: Diagnosis not present

## 2020-05-30 DIAGNOSIS — N6311 Unspecified lump in the right breast, upper outer quadrant: Secondary | ICD-10-CM | POA: Diagnosis not present

## 2020-05-30 DIAGNOSIS — R29898 Other symptoms and signs involving the musculoskeletal system: Secondary | ICD-10-CM | POA: Diagnosis not present

## 2020-05-30 DIAGNOSIS — R413 Other amnesia: Secondary | ICD-10-CM | POA: Diagnosis not present

## 2020-05-30 DIAGNOSIS — M2550 Pain in unspecified joint: Secondary | ICD-10-CM | POA: Diagnosis not present

## 2020-05-30 DIAGNOSIS — N6315 Unspecified lump in the right breast, overlapping quadrants: Secondary | ICD-10-CM | POA: Insufficient documentation

## 2020-05-30 DIAGNOSIS — N6313 Unspecified lump in the right breast, lower outer quadrant: Secondary | ICD-10-CM | POA: Diagnosis not present

## 2020-05-30 DIAGNOSIS — N6323 Unspecified lump in the left breast, lower outer quadrant: Secondary | ICD-10-CM | POA: Diagnosis not present

## 2020-06-02 ENCOUNTER — Other Ambulatory Visit: Payer: Self-pay | Admitting: Adult Health

## 2020-06-02 NOTE — Progress Notes (Signed)
Mammogram results sent to my chart radiology should have reviewed as well.

## 2020-06-09 ENCOUNTER — Ambulatory Visit: Payer: Medicaid Other | Admitting: Adult Health

## 2020-06-30 ENCOUNTER — Telehealth: Payer: Medicaid Other | Admitting: Physician Assistant

## 2020-06-30 DIAGNOSIS — R3989 Other symptoms and signs involving the genitourinary system: Secondary | ICD-10-CM | POA: Diagnosis not present

## 2020-06-30 MED ORDER — CIPROFLOXACIN HCL 500 MG PO TABS: 500.0000 mg | ORAL_TABLET | Freq: Two times a day (BID) | ORAL | 0 refills | Status: AC

## 2020-06-30 MED ORDER — FLUCONAZOLE 150 MG PO TABS: 150.0000 mg | ORAL_TABLET | Freq: Once | ORAL | 0 refills | Status: AC

## 2020-06-30 NOTE — Addendum Note (Signed)
Addended by: Chevis Pretty on: 06/30/2020 05:57 PM   Modules accepted: Orders

## 2020-06-30 NOTE — Progress Notes (Signed)
We are sorry that you are not feeling well.  Here is how we plan to help!  Based on what you shared with me it looks like you most likely have a simple urinary tract infection.  A UTI (Urinary Tract Infection) is a bacterial infection of the bladder.  Most cases of urinary tract infections are simple to treat but a key part of your care is to encourage you to drink plenty of fluids and watch your symptoms carefully.  I have prescribed Ciprofloxacin 500mg  Take one tablet twice daily for 5 days.  Your symptoms should gradually improve. Call us if the burning in your urine worsens, you develop worsening fever, back pain or pelvic pain or if your symptoms do not resolve after completing the antibiotic.  Urinary tract infections can be prevented by drinking plenty of water to keep your body hydrated.  Also be sure when you wipe, wipe from front to back and don't hold it in!  If possible, empty your bladder every 4 hours.  Your e-visit answers were reviewed by a board certified advanced clinical practitioner to complete your personal care plan.  Depending on the condition, your plan could have included both over the counter or prescription medications.  If there is a problem please reply  once you have received a response from your provider.  Your safety is important to [place].  If you have drug allergies check your prescription carefully.    You can use MyChart to ask questions about today's visit, request a non-urgent call back, or ask for a work or school excuse for 24 hours related to this e-Visit. If it has been greater than 24 hours you will need to follow up with your provider, or enter a new e-Visit to address those concerns.   You will get an e-mail in the next two days asking about your experience.  I hope that your e-visit has been valuable and will speed your recovery. Thank you for using e-visits.  I provided 6 minutes of non face-to-face time during this encounter for chart review and  documentation.

## 2020-07-11 ENCOUNTER — Ambulatory Visit: Payer: Self-pay | Admitting: *Deleted

## 2020-07-11 NOTE — Telephone Encounter (Signed)
Agree with UC or ER evaluation if worsening numbness, spasms and cramps. Keep appointment 07-24-20 for follow up.

## 2020-07-11 NOTE — Telephone Encounter (Signed)
C/o worsening numbness, muscle spasms, cramps and tremors since this week. Reports she has seen neurology for issues with her neck and has been "blown off " for chronic issues. Patient has noticed this week after sleeping worsening numbness in buttocks, back and left arm. Bilateral lower legs weak and feel like "wet noodles". Tremors in head and hands are worsening with numbness and tingling. C/o some chest pain denies shortness of breath , reports she feels chest pain due to neck issues. Denies dizziness, sweating . Appt scheduled for 07/24/20 per patient request and appt noted for 07/29/20. Please advise if appt can be on 07/24/20 and cancel 07/29/20. Care advise given. Patient verbalized understanding of care advise and to call back or go to [facility] or ED if symptoms worsen.

## 2020-07-11 NOTE — Telephone Encounter (Signed)
Reason for Disposition  Neck pain is a chronic symptom (recurrent or ongoing AND present > 4 weeks)  Answer Assessment - Initial Assessment Questions 1. ONSET: "When did the pain begin?"      This week neck is more numb and pops when moving.  2. LOCATION: "Where does it hurt?"     More numbness than pain  3. PATTERN "Does the pain come and go, or has it been constant since it started?"      Comes and goes . Chronic issues with neck and numbness . 4. SEVERITY: "How bad is the pain?"  (Scale 1-10; or mild, moderate, severe)   - NO PAIN (0): no pain or only slight stiffness    - MILD (1-3): doesn't interfere with normal activities    - MODERATE (4-7): interferes with normal activities or awakens from sleep    - SEVERE (8-10):  excruciating pain, unable to do any normal activities      Na  5. RADIATION: "Does the pain go anywhere else, shoot into your arms?"     Numbness/ tingling , muscle spasms , tremors in head and hands worse 6. CORD SYMPTOMS: "Any weakness or numbness of the arms or legs?"     Numbness buttocks, back, left arm and  bilateral legs feel weak "wet noodles" , tremors worse in hands  7. CAUSE: "What do you think is causing the neck pain?"     "Neck curved the wrong way" 8. NECK OVERUSE: "Any recent activities that involved turning or twisting the neck?"     No  9. OTHER SYMPTOMS: "Do you have any other symptoms?" (e.g., headache, fever, chest pain, difficulty breathing, neck swelling)     Chest pain off and on since numbness began 10. PREGNANCY: "Is there any chance you are pregnant?" "When was your last menstrual period?"       Na  Protocols used: Neck Pain or Stiffness-A-AH

## 2020-07-13 ENCOUNTER — Emergency Department
Admission: EM | Admit: 2020-07-13 | Discharge: 2020-07-13 | Disposition: A | Discharge status: Home / Self Care | Payer: Medicaid Other | Attending: Emergency Medicine | Admitting: Emergency Medicine

## 2020-07-13 ENCOUNTER — Emergency Department: Payer: Medicaid Other

## 2020-07-13 ENCOUNTER — Other Ambulatory Visit: Payer: Self-pay

## 2020-07-13 DIAGNOSIS — R200 Anesthesia of skin: Secondary | ICD-10-CM | POA: Insufficient documentation

## 2020-07-13 DIAGNOSIS — M47812 Spondylosis without myelopathy or radiculopathy, cervical region: Secondary | ICD-10-CM | POA: Diagnosis not present

## 2020-07-13 DIAGNOSIS — Z91040 Latex allergy status: Secondary | ICD-10-CM | POA: Diagnosis not present

## 2020-07-13 DIAGNOSIS — R531 Weakness: Secondary | ICD-10-CM | POA: Insufficient documentation

## 2020-07-13 DIAGNOSIS — R42 Dizziness and giddiness: Secondary | ICD-10-CM | POA: Diagnosis not present

## 2020-07-13 DIAGNOSIS — Z20822 Contact with and (suspected) exposure to covid-19: Secondary | ICD-10-CM | POA: Insufficient documentation

## 2020-07-13 DIAGNOSIS — M4802 Spinal stenosis, cervical region: Secondary | ICD-10-CM

## 2020-07-13 DIAGNOSIS — M50221 Other cervical disc displacement at C4-C5 level: Secondary | ICD-10-CM | POA: Diagnosis not present

## 2020-07-13 DIAGNOSIS — Z859 Personal history of malignant neoplasm, unspecified: Secondary | ICD-10-CM | POA: Diagnosis not present

## 2020-07-13 DIAGNOSIS — Z8616 Personal history of COVID-19: Secondary | ICD-10-CM | POA: Insufficient documentation

## 2020-07-13 LAB — CBC WITH DIFFERENTIAL/PLATELET
Abs Immature Granulocytes: 0.06 10*3/uL (ref 0.00–0.07)
Basophils Absolute: 0 10*3/uL (ref 0.0–0.1)
Basophils Relative: 0 %
Eosinophils Absolute: 0 10*3/uL (ref 0.0–0.5)
Eosinophils Relative: 0 %
HCT: 47.3 % — ABNORMAL HIGH (ref 36.0–46.0)
Hemoglobin: 16.5 g/dL — ABNORMAL HIGH (ref 12.0–15.0)
Immature Granulocytes: 1 %
Lymphocytes Relative: 24 %
Lymphs Abs: 2.6 10*3/uL (ref 0.7–4.0)
MCH: 31.7 pg (ref 26.0–34.0)
MCHC: 34.9 g/dL (ref 30.0–36.0)
MCV: 91 fL (ref 80.0–100.0)
Monocytes Absolute: 0.6 10*3/uL (ref 0.1–1.0)
Monocytes Relative: 6 %
Neutro Abs: 7.4 10*3/uL (ref 1.7–7.7)
Neutrophils Relative %: 69 %
Platelets: 208 10*3/uL (ref 150–400)
RBC: 5.2 MIL/uL — ABNORMAL HIGH (ref 3.87–5.11)
RDW: 12.3 % (ref 11.5–15.5)
WBC: 10.7 10*3/uL — ABNORMAL HIGH (ref 4.0–10.5)
nRBC: 0 % (ref 0.0–0.2)

## 2020-07-13 LAB — COMPREHENSIVE METABOLIC PANEL
ALT: 17 U/L (ref 0–44)
AST: 18 U/L (ref 15–41)
Albumin: 4.8 g/dL (ref 3.5–5.0)
Alkaline Phosphatase: 77 U/L (ref 38–126)
Anion gap: 6 (ref 5–15)
BUN: 9 mg/dL (ref 6–20)
CO2: 25 mmol/L (ref 22–32)
Calcium: 9.2 mg/dL (ref 8.9–10.3)
Chloride: 107 mmol/L (ref 98–111)
Creatinine, Ser: 0.74 mg/dL (ref 0.44–1.00)
GFR, Estimated: >60 mL/min (ref >60)
Glucose, Bld: 99 mg/dL (ref 70–99)
Potassium: 4 mmol/L (ref 3.5–5.1)
Sodium: 138 mmol/L (ref 135–145)
Total Bilirubin: 0.8 mg/dL (ref 0.3–1.2)
Total Protein: 7.8 g/dL (ref 6.5–8.1)

## 2020-07-13 LAB — TROPONIN I (HIGH SENSITIVITY): Troponin I (High Sensitivity): 3 ng/L (ref <18)

## 2020-07-13 LAB — RESP PANEL BY RT-PCR (FLU A&B, COVID) ARPGX2
Influenza A by PCR: NEGATIVE
Influenza B by PCR: NEGATIVE
SARS Coronavirus 2 by RT PCR: NEGATIVE

## 2020-07-13 LAB — MAGNESIUM: Magnesium: 2.2 mg/dL (ref 1.7–2.4)

## 2020-07-13 MED ORDER — IOHEXOL 350 MG/ML SOLN
75.0000 mL | Freq: Once | INTRAVENOUS | Status: AC | PRN
  Administered 2020-07-13: 75 mL via INTRAVENOUS

## 2020-07-13 NOTE — ED Notes (Signed)
Patient in MRI 

## 2020-07-13 NOTE — ED Notes (Signed)
Patient transported to CT 

## 2020-07-13 NOTE — ED Provider Notes (Signed)
[facility] Emergency Department Provider Note ____________________________________________   Event Date/Time   First MD Initiated Contact with Patient 07/13/20 1143     (approximate)  I have reviewed the triage vital signs and the nursing notes.   HISTORY  Chief Complaint Numbness  HPI Charlyn Vialpando is a 45 y.o. female with history of myoneural disorder presents to the emergency department for treatment and evaluation of left side arm and left leg numbness that was present upon awakening this morning.  She has no facial numbness.  She has no slurred speech or facial droop.  She says that she typically has random neuropathy/paresthesias related to her myoneural disorder but it has never been similar to today where it has affected her left arm and left leg.  She is also noticed that her grip strength in the left hand is less than normal.        Past Medical History:  Diagnosis Date   Allergy    Cancer ([place])    Insomnia    Neuromuscular disorder [facility])     Patient Active Problem List   Diagnosis Date Noted   Erythrocytosis 05/09/2020   Axillary lymphadenopathy 04/30/2020   Elevated hemoglobin ([place]) 04/30/2020   History of COVID-19 04/30/2020   Non-recurrent acute serous otitis media of left ear 04/30/2020   Abnormal CT lung screening 04/30/2020   Pulmonary emphysema ([place]) 04/30/2020   Pulmonary nodule 04/30/2020   Breast lump on right side at 9 o'clock position 04/30/2020   Sorethroat 04/30/2020   Screening for blood or protein in urine 01/11/2020   Urinary frequency 01/11/2020   History of recurrent urinary tract infection 01/11/2020   Neuromuscular disease ([place]) 01/11/2020   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  01/11/2020   Vitamin D insufficiency 01/11/2020    Past Surgical History:  Procedure Laterality Date   ABDOMINAL HYSTERECTOMY     cervisectomy  02/2016   CESAREAN SECTION     TUBAL LIGATION      Prior  to Admission medications   Medication Sig Start Date End Date Taking? Authorizing Provider  albuterol (VENTOLIN HFA) 108 (90 Base) MCG/ACT inhaler Inhale 2 puffs into the lungs every 6 (six) hours as needed for wheezing or shortness of breath. 04/30/20   Flinchum, Kelby Aline, FNP  clonazePAM (KLONOPIN) 0.5 MG tablet Take 1 tablet (0.5 mg total) by mouth 2 (two) times daily as needed for anxiety. Patient not taking: Reported on 05/09/2020 01/11/20   Flinchum, Kelby Aline, FNP  OVER THE COUNTER MEDICATION Probiotic    [provider]  Phenazopyridine HCl (PYRIDIUM PO) Take by mouth as needed.    [provider]  topiramate (TOPAMAX) 100 MG tablet Take 1 tablet (100 mg total) by mouth daily. 01/11/20   Flinchum, Kelby Aline, FNP  traZODone (DESYREL) 50 MG tablet TAKE 3 TABLETS BY MOUTH AT BEDTIME 06/02/20   Jerrol Banana., MD  Vitamin D, Ergocalciferol, (DRISDOL) 1.25 MG (50000 UNIT) CAPS capsule Take 1 capsule (50,000 Units total) by mouth every 7 (seven) days. (taking one tablet per week) walk in lab in office 1-2 weeks after completing prescription. 01/15/20   Flinchum, Kelby Aline, FNP    Allergies Cefdinir, Latex, Macrobid [nitrofurantoin], Morphine and related, Penicillins, Prednisone, and Sulfa antibiotics  Family History  Problem Relation Age of Onset   Hypertension Mother    Hypertension Father    Hyperlipidemia Father    Cancer Father        lung cancer  Asthma Son    Cancer Maternal Grandfather    Kidney disease Paternal Grandmother    Alzheimer's disease Paternal Grandfather    Breast cancer Paternal Aunt     Social History Social History   Tobacco Use   Smoking status: Never   Smokeless tobacco: Never  Substance Use Topics   Alcohol use: Yes    Alcohol/week: 1.0 standard drink    Types: 1 Glasses of wine per week   Drug use: Never    Review of Systems  Constitutional: No fever/chills Eyes: No visual changes. ENT: No sore  throat. Cardiovascular: Denies chest pain. Respiratory: Denies shortness of breath. Gastrointestinal: No abdominal pain.  No nausea, no vomiting.  No diarrhea.  No constipation. Genitourinary: Negative for dysuria. Musculoskeletal: Negative for back pain. Skin: Negative for rash. Neurological: Positive for numbness and weakness in left hand and numbness in left leg. ____________________________________________   PHYSICAL EXAM:  VITAL SIGNS: ED Triage Vitals  Enc Vitals Group     BP 07/13/20 1019 (!) 131/114     Pulse Rate 07/13/20 1019 (!) 111     Resp 07/13/20 1019 20     Temp 07/13/20 1019 98.4 F (36.9 C)     Temp Source 07/13/20 1019 Oral     SpO2 07/13/20 1019 98 %     Weight 07/13/20 1020 150 lb (68 kg)     Height 07/13/20 1020 5\' 8"  (1.727 m)     Head Circumference --      Peak Flow --      Pain Score 07/13/20 1020 0     Pain Loc --      Pain Edu? --      Excl. in [place]? --     Constitutional: Alert and oriented. Well appearing and in no acute distress. Eyes: Conjunctivae are normal. PERRL. EOMI. Head: Atraumatic. Nose: No congestion/rhinnorhea. Mouth/Throat: Mucous membranes are moist.  Oropharynx non-erythematous. Neck: No stridor.   Hematological/Lymphatic/Immunilogical: No cervical lymphadenopathy. Cardiovascular: Normal rate, regular rhythm. Grossly normal heart sounds.  Good peripheral circulation. Respiratory: Normal respiratory effort.  No retractions. Lungs CTAB. Gastrointestinal: Soft and nontender. No distention. No abdominal bruits. No CVA tenderness. Genitourinary:  Musculoskeletal: No lower extremity tenderness nor edema.  No joint effusions. Neurologic:  Normal speech and language. No gross focal neurologic deficits are appreciated. No gait instability. No pronator drift. Finger to nose testing slower on left, but coordinated. Negative Romberg. Skin:  Skin is warm, dry and intact. No rash noted. Psychiatric: Mood and affect are normal. Speech and  behavior are normal.  ____________________________________________   LABS (all labs ordered are listed, but only abnormal results are displayed)  Labs Reviewed  CBC WITH DIFFERENTIAL/PLATELET - Abnormal; Notable for the following components:      Result Value   WBC 10.7 (*)    RBC 5.20 (*)    Hemoglobin 16.5 (*)    HCT 47.3 (*)    All other components within normal limits  RESP PANEL BY RT-PCR (FLU A&B, COVID) ARPGX2  COMPREHENSIVE METABOLIC PANEL  MAGNESIUM  TROPONIN I (HIGH SENSITIVITY)   ____________________________________________  EKG  ED ECG REPORT I, Fynley Chrystal, FNP-BC personally viewed and interpreted this ECG.   Date: 07/13/2020  EKG Time: 1238  Rate: 80  Rhythm: normal sinus rhythm  Axis: normal  Intervals:none  ST&T Change: no ST elevation  ____________________________________________  RADIOLOGY  ED MD interpretation:    MR Brain without acute findings. MR Cervical spine shows severe left spinal stenosis at C5-6. CTA Neck  normal. I, Sherrie George, personally viewed and evaluated these images (plain radiographs) as part of my medical decision making, as well as reviewing the written report by the radiologist.  Official radiology report(s): CT Angio Neck W and/or Wo Contrast  Result Date: 07/13/2020 CLINICAL DATA:  Dizziness EXAM: CT ANGIOGRAPHY NECK TECHNIQUE: Multidetector CT imaging of the neck was performed using the standard protocol during bolus administration of intravenous contrast. Multiplanar CT image reconstructions and MIPs were obtained to evaluate the vascular anatomy. Carotid stenosis measurements (when applicable) are obtained utilizing NASCET criteria, using the distal internal carotid diameter as the denominator. CONTRAST:  85mL OMNIPAQUE IOHEXOL 350 MG/ML SOLN COMPARISON:  None. FINDINGS: Aortic arch: Great vessel origins are patent. Right carotid system: Patent. No stenosis or evidence of dissection. Left carotid system: Patent.  No  stenosis or evidence of dissection. Vertebral arteries: Patent. Left vertebral artery slightly dominant. No stenosis or evidence of dissection. Skeleton: Degenerative changes of the cervical spine, greatest at C5-C6 and C6-C7. Better evaluated on MRI performed earlier same day. Other neck: Unremarkable. Upper chest: Emphysema. IMPRESSION: No large vessel occlusion, hemodynamically significant stenosis, or evidence of dissection. Emphysema. Electronically Signed   By: Macy Mis M.D.   On: 07/13/2020 14:54   MR BRAIN WO CONTRAST  Result Date: 07/13/2020 CLINICAL DATA:  Dizziness, left-sided numbness EXAM: MRI HEAD WITHOUT CONTRAST TECHNIQUE: Multiplanar, multiecho pulse sequences of the brain and surrounding structures were obtained without intravenous contrast. COMPARISON:  None. FINDINGS: Brain: There is no acute infarction or intracranial hemorrhage. There is no intracranial mass, mass effect, or edema. There is no hydrocephalus or extra-axial fluid collection. Ventricles and sulci are normal in size and configuration. Minimal small foci of T2 hyperintensity in the supratentorial white matter likely reflect nonspecific gliosis/demyelination of doubtful clinical significance. Vascular: Major vessel flow voids at the skull base are preserved. Skull and upper cervical spine: Normal marrow signal is preserved. Sinuses/Orbits: Paranasal sinuses are aerated. Orbits are unremarkable. Other: Sella is unremarkable.  Mastoid air cells are clear. IMPRESSION: No evidence of recent infarction, hemorrhage, or mass. Electronically Signed   By: Macy Mis M.D.   On: 07/13/2020 12:26   MR Cervical Spine Wo Contrast  Result Date: 07/13/2020 CLINICAL DATA:  Left-sided numbness. EXAM: MRI CERVICAL SPINE WITHOUT CONTRAST TECHNIQUE: Multiplanar, multisequence MR imaging of the cervical spine was performed. No intravenous contrast was administered. COMPARISON:  None. FINDINGS: Alignment: Mild straightening of the normal  cervical lordosis. No significant listhesis. Vertebrae: No fracture, evidence of discitis, or bone lesion. Cord: Normal signal and morphology. Posterior Fossa, vertebral arteries, paraspinal tissues: Negative. Disc levels: C2-C3: Tiny right paracentral disc osteophyte complex. No stenosis. C3-C4:  Negative. C4-C5: Small to moderate central disc extrusion migrating superiorly. Slight flattening of the ventral cord. No stenosis. C5-C6: Small posterior disc osteophyte complex eccentric to the left. Moderate left and mild right uncovertebral hypertrophy. Mild spinal canal stenosis. Severe left and mild right neuroforaminal stenosis. C6-C7: Small posterior disc osteophyte complex and mild right uncovertebral hypertrophy. Mild right neuroforaminal stenosis. No spinal canal or left neuroforaminal stenosis. C7-T1:  Small central disc protrusion.  No stenosis. IMPRESSION: 1. Multilevel degenerative changes of the cervical spine as described above, worst at C5-C6 where there is severe left neuroforaminal stenosis. 2. Small to moderate central disc extrusion at C4-C5 without stenosis. Electronically Signed   By: Titus Dubin M.D.   On: 07/13/2020 12:23    ____________________________________________   PROCEDURES  Procedure(s) performed (including Critical Care):  Procedures  ____________________________________________   INITIAL  IMPRESSION / ASSESSMENT AND PLAN     45 year old female presenting to the emergency department for treatment of weakness and numbness to the left arm and leg. See HPI.   DIFFERENTIAL DIAGNOSIS  TIA, CVA, myoneural disorder  ED COURSE  Workup without acute concerns. She does have severe stenosis C5-6. Dr. Lacinda Axon with neurosurgery consulted. Since she is allergic to steroids, no medication recommendations at this time. He will follow with her in clinic.    ___________________________________________   FINAL CLINICAL IMPRESSION(S) / ED DIAGNOSES  Spoke with Dr. Lacinda Axon who  will see her in clinic.  Final diagnoses:  Numbness  Weakness  Spinal stenosis of cervical region     ED Discharge Orders     None        Grete Bosko was evaluated in Emergency Department on 07/14/2020 for the symptoms described in the history of present illness. She was evaluated in the context of the global COVID-19 pandemic, which necessitated consideration that the patient might be at risk for infection with the SARS-CoV-2 virus that causes COVID-19. Institutional protocols and algorithms that pertain to the evaluation of patients at risk for COVID-19 are in a state of rapid change based on information released by regulatory bodies including the CDC and federal and state organizations. These policies and algorithms were followed during the patient's care in the ED.   Note:  This document was prepared using Dragon voice recognition software and may include unintentional dictation errors.    Victorino Dike, FNP 07/14/20 1137    Vanessa Benson, MD 07/14/20 1159

## 2020-07-13 NOTE — Discharge Instructions (Addendum)
Please call and schedule an appointment with neurosurgery.   Continue your medications as prescribed.  Return to the ER for symptoms that change or worsen if unable to see primary care or the orthopedist.

## 2020-07-13 NOTE — ED Triage Notes (Signed)
Pt reports for the last week every time she turns her neck she would feel a pop and a part of her body would go numb. Pt reports she woke up this am and the left side of her body was numb. Pt reports has seen a neurologist for years and was told her spine was curved the wring way and would constantly ask about issues like this but she was just brushed off. Pt denies pain, SOB, or other sx's, states that she is numb.

## 2020-07-14 ENCOUNTER — Telehealth: Payer: Self-pay | Admitting: *Deleted

## 2020-07-14 NOTE — Telephone Encounter (Signed)
Transition Care Management Unsuccessful Follow-up Telephone Call  Date of discharge and from where:  07/13/2020 [facility] ED  Attempts:  1st Attempt  Reason for unsuccessful TCM follow-up call:  Left voice message

## 2020-07-15 NOTE — Telephone Encounter (Signed)
Transition Care Management Follow-up Telephone Call Date of discharge and from where: 07/13/2020 [facility] ED How have you been since you were released from the hospital? "About the same" Any questions or concerns? No  Items Reviewed: Did the pt receive and understand the discharge instructions provided? Yes  Medications obtained and verified? Yes  Other? No  Any new allergies since your discharge? No  Dietary orders reviewed? No Do you have support at home? No    Functional Questionnaire: (I = Independent and D = Dependent) ADLs: I  Bathing/Dressing- I  Meal Prep- I  Eating- I  Maintaining continence- I  Transferring/Ambulation- I  Managing Meds- I  Follow up appointments reviewed:  PCP Hospital f/u appt confirmed? Yes  Scheduled to see PCP on 07/24/2020 @ 0800. [facility] f/u appt confirmed? No   Are transportation arrangements needed? No  If their condition worsens, is the pt aware to call PCP or go to the Emergency Dept.? Yes Was the patient provided with contact information for the PCP's office or ED? Yes Was to pt encouraged to call back with questions or concerns? Yes

## 2020-07-20 ENCOUNTER — Other Ambulatory Visit: Payer: Self-pay | Admitting: Family Medicine

## 2020-07-20 NOTE — Telephone Encounter (Signed)
Requested Prescriptions  Pending Prescriptions Disp Refills  . traZODone (DESYREL) 50 MG tablet [Pharmacy Med Name: traZODone HCl 50 MG Oral Tablet] 90 tablet 0    Sig: TAKE 3 TABLETS BY MOUTH AT BEDTIME     Psychiatry: Antidepressants - Serotonin Modulator Passed - 07/20/2020  6:30 AM      Passed - Valid encounter within last 6 months    Recent Outpatient Visits          2 months ago Neuromuscular disease [facility])   [place] Flinchum, Kelby Aline, FNP   6 months ago Urinary frequency   [place] Flinchum, Kelby Aline, FNP      Future Appointments            In 1 week Chrismon, Vickki Muff, PA-C [employer], [employer]

## 2020-07-22 DIAGNOSIS — R200 Anesthesia of skin: Secondary | ICD-10-CM | POA: Diagnosis not present

## 2020-07-24 ENCOUNTER — Ambulatory Visit: Payer: Self-pay | Admitting: Family Medicine

## 2020-07-27 DIAGNOSIS — R258 Other abnormal involuntary movements: Secondary | ICD-10-CM | POA: Diagnosis not present

## 2020-07-27 DIAGNOSIS — R9401 Abnormal electroencephalogram [EEG]: Secondary | ICD-10-CM | POA: Diagnosis not present

## 2020-07-29 ENCOUNTER — Ambulatory Visit: Payer: Medicaid Other | Admitting: Family Medicine

## 2020-07-29 ENCOUNTER — Encounter: Payer: Self-pay | Admitting: Family Medicine

## 2020-07-29 ENCOUNTER — Other Ambulatory Visit: Payer: Self-pay

## 2020-07-29 VITALS — BP 101/71 | HR 100 | Temp 97.9°F | Resp 16 | Ht 69.0 in | Wt 147.0 lb

## 2020-07-29 DIAGNOSIS — R350 Frequency of micturition: Secondary | ICD-10-CM

## 2020-07-29 DIAGNOSIS — D582 Other hemoglobinopathies: Secondary | ICD-10-CM

## 2020-07-29 DIAGNOSIS — E785 Hyperlipidemia, unspecified: Secondary | ICD-10-CM | POA: Diagnosis not present

## 2020-07-29 DIAGNOSIS — G709 Myoneural disorder, unspecified: Secondary | ICD-10-CM

## 2020-07-29 LAB — POCT URINALYSIS DIPSTICK
Bilirubin, UA: NEGATIVE
Blood, UA: NEGATIVE
Glucose, UA: NEGATIVE
Ketones, UA: NEGATIVE
Leukocytes, UA: NEGATIVE
Nitrite, UA: NEGATIVE
Protein, UA: NEGATIVE
Spec Grav, UA: 1.01 (ref 1.010–1.025)
Urobilinogen, UA: 0.2 E.U./dL
pH, UA: 6 (ref 5.0–8.0)

## 2020-07-29 NOTE — Progress Notes (Addendum)
I,April Miller,acting as a Education administrator for [employer], PA-C.,have documented all relevant documentation on the behalf of Mikayla Murders, PA-C,as directed by  [employer], PA-C while in the presence of [employer], PA-C.   Established patient visit   Patient: Mikayla Walsh   DOB: 1975/05/08   45 y.o. Female  MRN: [id number] Visit Date: 07/29/2020  Today's healthcare provider: [name], PA-C   Chief Complaint  Patient presents with   Follow-up   Subjective    HPI  Follow up for Elevated hemoglobin [facility])  The patient was last seen for this 3 months ago. Changes made at last visit include; labs ordered, but zero were done.  She reports good compliance with treatment. She feels that condition is Unchanged. She is not having side effects. none  ----------------------------------------------------------------------------   Patient is also having urine frequency and believes she may have a kidney stone.   Past Medical History:  Diagnosis Date   Allergy    Cancer ([facility])    Insomnia    Neuromuscular disorder [facility])    Past Surgical History:  Procedure Laterality Date   ABDOMINAL HYSTERECTOMY     cervisectomy  02/2016   CESAREAN SECTION     TUBAL LIGATION     Social History   Tobacco Use   Smoking status: Never   Smokeless tobacco: Never  Substance Use Topics   Alcohol use: Yes    Alcohol/week: 1.0 standard drink    Types: 1 Glasses of wine per week   Drug use: Never   Family Status  Relation Name Status   Mother  (Not Specified)   Father  (Not Specified)   Son  (Not Specified)   MGF  (Not Specified)   PGM  (Not Specified)   PGF  (Not Specified)   Fraser Din Aunt  (Not Specified)   Allergies  Allergen Reactions   Cefdinir    Latex    Macrobid [Nitrofurantoin] Other (See Comments)    Chest pain   Morphine And Related Hives and Swelling   Penicillins Hives    Redness, itching   Prednisone Hives and Swelling   Sulfa Antibiotics Hives     Itching and redness       Medications: Outpatient Medications Prior to Visit  Medication Sig   albuterol (VENTOLIN HFA) 108 (90 Base) MCG/ACT inhaler Inhale 2 puffs into the lungs every 6 (six) hours as needed for wheezing or shortness of breath.   clonazePAM (KLONOPIN) 0.5 MG tablet Take 1 tablet (0.5 mg total) by mouth 2 (two) times daily as needed for anxiety.   OVER THE COUNTER MEDICATION Probiotic   Phenazopyridine HCl (PYRIDIUM PO) Take by mouth as needed.   topiramate (TOPAMAX) 100 MG tablet Take 1 tablet (100 mg total) by mouth daily.   traZODone (DESYREL) 50 MG tablet TAKE 3 TABLETS BY MOUTH AT BEDTIME   Vitamin D, Ergocalciferol, (DRISDOL) 1.25 MG (50000 UNIT) CAPS capsule Take 1 capsule (50,000 Units total) by mouth every 7 (seven) days. (taking one tablet per week) walk in lab in office 1-2 weeks after completing prescription.   No facility-administered medications prior to visit.    Review of Systems  Constitutional:  Negative for appetite change, chills, fatigue and fever.  Respiratory:  Negative for chest tightness and shortness of breath.   Cardiovascular:  Negative for chest pain and palpitations.  Gastrointestinal:  Negative for abdominal pain, nausea and vomiting.  Neurological:  Negative for dizziness and weakness.   Last CBC Lab Results  Component  Value Date   WBC 10.7 (H) 07/13/2020   HGB 16.5 (H) 07/13/2020   HCT 47.3 (H) 07/13/2020   MCV 91.0 07/13/2020   MCH 31.7 07/13/2020   RDW 12.3 07/13/2020   PLT 208 48/18/5631   Last metabolic panel Lab Results  Component Value Date   GLUCOSE 99 07/13/2020   NA 138 07/13/2020   K 4.0 07/13/2020   CL 107 07/13/2020   CO2 25 07/13/2020   BUN 9 07/13/2020   CREATININE 0.74 07/13/2020   GFRNONAA >60 07/13/2020   GFRAA 96 01/11/2020   CALCIUM 9.2 07/13/2020   PROT 7.8 07/13/2020   ALBUMIN 4.8 07/13/2020   LABGLOB 2.7 01/11/2020   AGRATIO 1.7 01/11/2020   BILITOT 0.8 07/13/2020   ALKPHOS 77 07/13/2020    AST 18 07/13/2020   ALT 17 07/13/2020   ANIONGAP 6 07/13/2020       Objective    BP 101/71 (BP Location: Left Arm, Patient Position: Sitting, Cuff Size: Normal)   Pulse 100   Temp 97.9 F (36.6 C) (Temporal)   Resp 16   Ht 5\' 9"  (1.753 m)   Wt 147 lb (66.7 kg)   SpO2 98%   BMI 21.71 kg/m  Wt Readings from Last 3 Encounters:  07/29/20 147 lb (66.7 kg)  07/13/20 150 lb (68 kg)  05/09/20 148 lb 12.8 oz (67.5 kg)       Physical Exam Constitutional:      General: She is not in acute distress.    Appearance: She is well-developed.  HENT:     Head: Normocephalic and atraumatic.     Right Ear: Hearing normal.     Left Ear: Hearing normal.     Nose: Nose normal.  Eyes:     General: Lids are normal. No scleral icterus.       Right eye: No discharge.        Left eye: No discharge.     Conjunctiva/sclera: Conjunctivae normal.  Cardiovascular:     Rate and Rhythm: Normal rate and regular rhythm.     Heart sounds: Normal heart sounds.  Pulmonary:     Effort: Pulmonary effort is normal. No respiratory distress.     Breath sounds: Normal breath sounds.  Abdominal:     General: Bowel sounds are normal.     Palpations: Abdomen is soft.  Musculoskeletal:        General: Normal range of motion.     Cervical back: Neck supple.  Skin:    Findings: No lesion or rash.  Neurological:     Mental Status: She is alert and oriented to person, place, and time.  Psychiatric:        Speech: Speech normal.        Behavior: Behavior normal.        Thought Content: Thought content normal.     No results found for any visits on 07/29/20.  Assessment & Plan     1. Elevated hemoglobin (HCC) Followed by Dr. Rogue Bussing (hematologist). No dizziness. Recheck CBC.  2. Hyperlipidemia, unspecified hyperlipidemia type Elevated lipids on 01-11-20. Recommend she continue low fat diet. Will recheck labs. Has lost weight with increase in exercise since that time. - CBC with  Differential/Platelet - Comprehensive metabolic panel - Lipid panel - TSH  3. Neuromuscular disease (HCC) History of intermittent left sided numbness followed by neurologist (Dr. Lacinda Axon). No numbness today. This had occurred with some weakness and questionable bladder symptoms.Neurologist did EEG 2 days ago and awaiting final report.  Follow up with neurologist (Dr. Lacinda Axon). - CBC with Differential/Platelet - Comprehensive metabolic panel - TSH  4. Urinary frequency Some frequent urination without dyspnea or hematuria. No CVA tenderness to percussion. History of kidney stones. Offered Flomax with continued hydration. No pain. Check CBC for signs of infection. Suspect associated with numbness and weakness. Urinalysis normal without signs of infection. - CBC with Differential/Platelet - POCT urinalysis dipstick  No follow-ups on file.      I, Markel Mergenthaler, PA-C, have reviewed all documentation for this visit. The documentation on 07/29/20 for the exam, diagnosis, procedures, and orders are all accurate and complete.    Mikayla Murders, PA-C  [employer] [phone] (phone) [phone] (fax)  [place]

## 2020-07-31 ENCOUNTER — Encounter: Payer: Self-pay | Admitting: Family Medicine

## 2020-07-31 LAB — COMPREHENSIVE METABOLIC PANEL
ALT: 13 IU/L (ref 0–32)
AST: 17 IU/L (ref 0–40)
Albumin/Globulin Ratio: 1.8 (ref 1.2–2.2)
Albumin: 5 g/dL — ABNORMAL HIGH (ref 3.8–4.8)
Alkaline Phosphatase: 94 IU/L (ref 44–121)
BUN/Creatinine Ratio: 8 — ABNORMAL LOW (ref 9–23)
BUN: 7 mg/dL (ref 6–24)
Bilirubin Total: <0.2 mg/dL (ref 0.0–1.2)
CO2: 21 mmol/L (ref 20–29)
Calcium: 9.7 mg/dL (ref 8.7–10.2)
Chloride: 102 mmol/L (ref 96–106)
Creatinine, Ser: 0.89 mg/dL (ref 0.57–1.00)
Globulin, Total: 2.8 g/dL (ref 1.5–4.5)
Glucose: 91 mg/dL (ref 65–99)
Potassium: 4.1 mmol/L (ref 3.5–5.2)
Sodium: 142 mmol/L (ref 134–144)
Total Protein: 7.8 g/dL (ref 6.0–8.5)
eGFR: 82 mL/min/{1.73_m2} (ref >59)

## 2020-07-31 LAB — CBC WITH DIFFERENTIAL/PLATELET
Basophils Absolute: 0.1 10*3/uL (ref 0.0–0.2)
Basos: 1 %
EOS (ABSOLUTE): 0 10*3/uL (ref 0.0–0.4)
Eos: 0 %
Hematocrit: 47.1 % — ABNORMAL HIGH (ref 34.0–46.6)
Hemoglobin: 16.3 g/dL — ABNORMAL HIGH (ref 11.1–15.9)
Immature Grans (Abs): 0.1 10*3/uL (ref 0.0–0.1)
Immature Granulocytes: 1 %
Lymphocytes Absolute: 2.2 10*3/uL (ref 0.7–3.1)
Lymphs: 22 %
MCH: 31.6 pg (ref 26.6–33.0)
MCHC: 34.6 g/dL (ref 31.5–35.7)
MCV: 91 fL (ref 79–97)
Monocytes Absolute: 0.6 10*3/uL (ref 0.1–0.9)
Monocytes: 6 %
Neutrophils Absolute: 7.3 10*3/uL — ABNORMAL HIGH (ref 1.4–7.0)
Neutrophils: 70 %
Platelets: 242 10*3/uL (ref 150–450)
RBC: 5.16 x10E6/uL (ref 3.77–5.28)
RDW: 11.8 % (ref 11.7–15.4)
WBC: 10.3 10*3/uL (ref 3.4–10.8)

## 2020-07-31 LAB — TSH: TSH: 0.772 u[IU]/mL (ref 0.450–4.500)

## 2020-07-31 LAB — LIPID PANEL
Chol/HDL Ratio: 6 ratio — ABNORMAL HIGH (ref 0.0–4.4)
Cholesterol, Total: 251 mg/dL — ABNORMAL HIGH (ref 100–199)
HDL: 42 mg/dL (ref >39)
LDL Chol Calc (NIH): 168 mg/dL — ABNORMAL HIGH (ref 0–99)
Triglycerides: 219 mg/dL — ABNORMAL HIGH (ref 0–149)
VLDL Cholesterol Cal: 41 mg/dL — ABNORMAL HIGH (ref 5–40)

## 2020-08-01 ENCOUNTER — Telehealth: Payer: Self-pay

## 2020-08-01 MED ORDER — SIMVASTATIN 20 MG PO TABS: 20.0000 mg | ORAL_TABLET | Freq: Every day | ORAL | 3 refills | Status: DC

## 2020-08-01 NOTE — Telephone Encounter (Signed)
-----   Message from Margo Common, PA-C sent at 07/31/2020  2:24 PM EDT ----- Still some elevation of hemoglobin but improvement in WBC count with normal platelets. Normal kidney function and no sign of infection. Cholesterol and triglycerides still high. Should consider Simvastatin 20 mg qd #90 & 3 RF and recheck lipids in 3-4 months with someone here.

## 2020-08-01 NOTE — Telephone Encounter (Signed)
Pt advised of lab results.  She agreed to start simvastatin RX sent to [facility].   Pt states she will call back and schedule a follow up visit.   Thanks,   -Mickel Baas

## 2020-08-06 NOTE — Addendum Note (Signed)
Addended by: [name] E on: 08/06/2020 08:22 PM   Modules accepted: Orders

## 2020-08-25 ENCOUNTER — Other Ambulatory Visit: Payer: Self-pay | Admitting: Family Medicine

## 2020-08-25 ENCOUNTER — Encounter: Payer: Self-pay | Admitting: Family Medicine

## 2020-09-03 ENCOUNTER — Other Ambulatory Visit: Payer: Self-pay | Admitting: Family Medicine

## 2020-09-03 ENCOUNTER — Other Ambulatory Visit: Payer: Self-pay | Admitting: Adult Health

## 2020-09-17 ENCOUNTER — Telehealth: Payer: Medicaid Other | Admitting: Physician Assistant

## 2020-09-17 DIAGNOSIS — R300 Dysuria: Secondary | ICD-10-CM

## 2020-09-17 MED ORDER — CIPROFLOXACIN HCL 250 MG PO TABS
250.0000 mg | ORAL_TABLET | Freq: Two times a day (BID) | ORAL | 0 refills | Status: AC
Start: 2020-09-17 — End: 2020-09-22

## 2020-09-17 NOTE — Progress Notes (Signed)
E-Visit for Urinary Problems  We are sorry that you are not feeling well.  Here is how we plan to help!  Based on what you shared with me it looks like you most likely have a simple urinary tract infection.  A UTI (Urinary Tract Infection) is a bacterial infection of the bladder.  Most cases of urinary tract infections are simple to treat but a key part of your care is to encourage you to drink plenty of fluids and watch your symptoms carefully.  I have prescribed  Cipro 250 mg twice daily x 5 days giving your multiple medication allergies/intolerances.  Your symptoms should gradually improve. Call us if the burning in your urine worsens, you develop worsening fever, back pain or pelvic pain or if your symptoms do not resolve after completing the antibiotic.  Urinary tract infections can be prevented by drinking plenty of water to keep your body hydrated.  Also be sure when you wipe, wipe from front to back and don't hold it in!  If possible, empty your bladder every 4 hours.  HOME CARE Drink plenty of fluids Compete the full course of the antibiotics even if the symptoms resolve Remember, when you need to go.go. Holding in your urine can increase the likelihood of getting a UTI! GET HELP RIGHT AWAY IF: You cannot urinate You get a high fever Worsening back pain occurs You see blood in your urine You feel sick to your stomach or throw up You feel like you are going to pass out  MAKE SURE YOU  Understand these instructions. Will watch your condition. Will get help right away if you are not doing well or get worse.   Thank you for choosing an e-visit.  Your e-visit answers were reviewed by a board certified advanced clinical practitioner to complete your personal care plan. Depending upon the condition, your plan could have included both over the counter or prescription medications.  Please review your pharmacy choice. Make sure the pharmacy is open so you can pick up prescription  now. If there is a problem, you may contact your provider through [employer] and have the prescription routed to another pharmacy.  Your safety is important to [place]. If you have drug allergies check your prescription carefully.   For the next 24 hours you can use MyChart to ask questions about today's visit, request a non-urgent call back, or ask for a work or school excuse. You will get an email in the next two days asking about your experience. I hope that your e-visit has been valuable and will speed your recovery.

## 2020-09-17 NOTE — Progress Notes (Signed)
I have spent 5 minutes in review of e-visit questionnaire, review and updating patient chart, medical decision making and response to patient.   Sentoria Brent Cody Mylan Schwarz, PA-C    

## 2020-09-19 DIAGNOSIS — Z113 Encounter for screening for infections with a predominantly sexual mode of transmission: Secondary | ICD-10-CM | POA: Diagnosis not present

## 2020-09-19 DIAGNOSIS — N898 Other specified noninflammatory disorders of vagina: Secondary | ICD-10-CM | POA: Diagnosis not present

## 2020-09-19 DIAGNOSIS — R300 Dysuria: Secondary | ICD-10-CM | POA: Diagnosis not present

## 2020-09-22 ENCOUNTER — Ambulatory Visit: Payer: Medicaid Other

## 2020-10-20 ENCOUNTER — Encounter: Payer: Self-pay | Admitting: Family Medicine

## 2020-10-21 ENCOUNTER — Other Ambulatory Visit: Payer: Self-pay | Admitting: Family Medicine

## 2020-10-21 DIAGNOSIS — E785 Hyperlipidemia, unspecified: Secondary | ICD-10-CM

## 2020-10-21 MED ORDER — EZETIMIBE 10 MG PO TABS: 10.0000 mg | ORAL_TABLET | Freq: Every day | ORAL | 1 refills | Status: DC

## 2020-10-21 NOTE — Progress Notes (Signed)
Internal soreness and swelling in hands she felt was due to the Simvastatin, has resolved and wants to try another cholesterol medication now. Will send Zetia 10 mg qd to her pharmacy and advised she should schedule follow up here in 2-3 months with fasting labs.

## 2020-11-05 ENCOUNTER — Ambulatory Visit: Payer: Self-pay

## 2020-11-05 NOTE — Telephone Encounter (Signed)
Patient advised.KW 

## 2020-11-05 NOTE — Telephone Encounter (Signed)
Patient called in stated that she have some concerns states that her insides feels like they are all being squeezed together under her ribs. Say that she have told this to her neurologist but he basically just blew it off and she is concerned. Appointment scheduled  11/17/20  Please call Ph# [phone]  Pt. Reports she has had upper abdominal pain x 1 year that comes and goes. "Seems to be getting worse." States sometimes "it feels like gas pains." No diarrhea or constipation. No availability this week. Would like to be worked in. Will consider going to UC. Please advise.   Reason for Disposition  Abdominal pain is a chronic symptom (recurrent or ongoing AND present > 4 weeks)  Answer Assessment - Initial Assessment Questions 1. LOCATION: "Where does it hurt?"      Upper abd. 2. RADIATION: "Does the pain shoot anywhere else?" (e.g., chest, back)     No 3. ONSET: "When did the pain begin?" (e.g., minutes, hours or days ago)      1 year ago 4. SUDDEN: "Gradual or sudden onset?"     Gradual 5. PATTERN "Does the pain come and go, or is it constant?"    - If constant: "Is it getting better, staying the same, or worsening?"      (Note: Constant means the pain never goes away completely; most serious pain is constant and it progresses)     - If intermittent: "How long does it last?" "Do you have pain now?"     (Note: Intermittent means the pain goes away completely between bouts)     Comes and goes 6. SEVERITY: "How bad is the pain?"  (e.g., Scale 1-10; mild, moderate, or severe)   - MILD (1-3): doesn't interfere with normal activities, abdomen soft and not tender to touch    - MODERATE (4-7): interferes with normal activities or awakens from sleep, abdomen tender to touch    - SEVERE (8-10): excruciating pain, doubled over, unable to do any normal activities      6 7. RECURRENT SYMPTOM: "Have you ever had this type of stomach pain before?" If Yes, ask: "When was the last time?" and "What  happened that time?"      Yes 8. CAUSE: "What do you think is causing the stomach pain?"     Unsure 9. RELIEVING/AGGRAVATING FACTORS: "What makes it better or worse?" (e.g., movement, antacids, bowel movement)     No 10. OTHER SYMPTOMS: "Do you have any other symptoms?" (e.g., back pain, diarrhea, fever, urination pain, vomiting)       No 11. PREGNANCY: "Is there any chance you are pregnant?" "When was your last menstrual period?"       No  Protocols used: Abdominal Pain - [facility]

## 2020-11-17 ENCOUNTER — Ambulatory Visit (INDEPENDENT_AMBULATORY_CARE_PROVIDER_SITE_OTHER): Payer: Medicaid Other | Admitting: Family Medicine

## 2020-11-17 ENCOUNTER — Encounter: Payer: Self-pay | Admitting: Family Medicine

## 2020-11-17 ENCOUNTER — Other Ambulatory Visit: Payer: Self-pay

## 2020-11-17 VITALS — BP 129/88 | HR 94 | Temp 98.4°F | Resp 16 | Ht 69.0 in | Wt 149.0 lb

## 2020-11-17 DIAGNOSIS — Z8541 Personal history of malignant neoplasm of cervix uteri: Secondary | ICD-10-CM | POA: Diagnosis not present

## 2020-11-17 DIAGNOSIS — F419 Anxiety disorder, unspecified: Secondary | ICD-10-CM

## 2020-11-17 DIAGNOSIS — Z23 Encounter for immunization: Secondary | ICD-10-CM

## 2020-11-17 DIAGNOSIS — R918 Other nonspecific abnormal finding of lung field: Secondary | ICD-10-CM

## 2020-11-17 DIAGNOSIS — R1084 Generalized abdominal pain: Secondary | ICD-10-CM

## 2020-11-17 LAB — POCT URINALYSIS DIPSTICK
Bilirubin, UA: NEGATIVE
Blood, UA: NEGATIVE
Glucose, UA: NEGATIVE
Ketones, UA: NEGATIVE
Leukocytes, UA: NEGATIVE
Nitrite, UA: NEGATIVE
Protein, UA: NEGATIVE
Spec Grav, UA: 1.01 (ref 1.010–1.025)
Urobilinogen, UA: 0.2 E.U./dL
pH, UA: 8.5 — AB (ref 5.0–8.0)

## 2020-11-17 MED ORDER — DICYCLOMINE HCL 20 MG PO TABS: 20.0000 mg | ORAL_TABLET | Freq: Two times a day (BID) | ORAL | 1 refills | Status: DC | PRN

## 2020-11-17 NOTE — Progress Notes (Signed)
I,April Miller,acting as a scribe for Wilhemena Durie, MD.,have documented all relevant documentation on the behalf of Wilhemena Durie, MD,as directed by  Wilhemena Durie, MD while in the presence of Wilhemena Durie, MD.   Established patient visit   Patient: Mikayla Walsh   DOB: [birth year]   45 y.o. Female  MRN: [id number] Visit Date: 11/17/2020  Today's healthcare provider: Wilhemena Durie, MD   Chief Complaint  Patient presents with   Abdominal Pain   Subjective    Abdominal Pain This is a new problem. The current episode started more than 1 year ago. The onset quality is gradual. The problem occurs intermittently. The most recent episode lasted 12 hours. The problem has been gradually worsening. The pain is located in the generalized abdominal region. The pain is at a severity of 6/10. The pain is moderate. Quality: squeezing and contracting. The abdominal pain radiates to the right flank and left flank. Associated symptoms include anorexia, arthralgias, frequency, myalgias and nausea. Pertinent negatives include no belching, constipation, diarrhea, dysuria, fever, flatus, headaches, hematochezia, hematuria, melena, vomiting or weight loss. Nothing aggravates the pain. The pain is relieved by Nothing. She has tried acetaminophen (gas pills, heating pad and hot baths) for the symptoms. The treatment provided no relief.    Patient states for the past year she has had intermittent abdominal pain. Pain is described as contracting, squeezing. Pain is located in general abdomen and sometimes radiates to sides. No urine symptoms or bowel symptoms. Patient states she has tried gas pills, heating pad and hot baths with no relief.  There are no aggravating or alleviating circumstances.  Does not seem to be related eating.  No GI or GU symptoms.  No weight loss. He also says she has joint swelling and soreness recently and points to her hands.    Medications: Outpatient  Medications Prior to Visit  Medication Sig   albuterol (VENTOLIN HFA) 108 (90 Base) MCG/ACT inhaler Inhale 2 puffs into the lungs every 6 (six) hours as needed for wheezing or shortness of breath.   ezetimibe (ZETIA) 10 MG tablet Take 1 tablet (10 mg total) by mouth daily.   Phenazopyridine HCl (PYRIDIUM PO) Take by mouth as needed.   Vitamin D, Ergocalciferol, (DRISDOL) 1.25 MG (50000 UNIT) CAPS capsule Take 1 capsule (50,000 Units total) by mouth every 7 (seven) days. (taking one tablet per week) walk in lab in office 1-2 weeks after completing prescription.   clonazePAM (KLONOPIN) 0.5 MG tablet Take 1 tablet by mouth twice daily as needed for anxiety   OVER THE COUNTER MEDICATION Probiotic   topiramate (TOPAMAX) 100 MG tablet Take 1 tablet (100 mg total) by mouth daily.   traZODone (DESYREL) 50 MG tablet TAKE 3 TABLETS BY MOUTH AT BEDTIME   No facility-administered medications prior to visit.    Review of Systems  Constitutional:  Negative for appetite change, chills, fatigue, fever and weight loss.  Respiratory:  Negative for chest tightness and shortness of breath.   Cardiovascular:  Negative for chest pain and palpitations.  Gastrointestinal:  Positive for abdominal pain, anorexia and nausea. Negative for constipation, diarrhea, flatus, hematochezia, melena and vomiting.  Genitourinary:  Positive for frequency. Negative for dysuria and hematuria.  Musculoskeletal:  Positive for arthralgias and myalgias.  Neurological:  Negative for dizziness, weakness and headaches.       Objective    BP 129/88 (BP Location: Left Arm, Patient Position: Sitting, Cuff Size: Normal)   Pulse 94  Temp 98.4 F (36.9 C) (Temporal)   Resp 16   Ht _0  (1.753 m)   Wt 149 lb (67.6 kg)   SpO2 98%   BMI 22.00 kg/m  BP Readings from Last 3 Encounters:  11/17/20 129/88  07/29/20 101/71  07/13/20 120/78   Wt Readings from Last 3 Encounters:  11/17/20 149 lb (67.6 kg)  07/29/20 147 lb (66.7 kg)   07/13/20 150 lb (68 kg)      Physical Exam Vitals reviewed.  Constitutional:      General: She is not in acute distress.    Appearance: She is well-developed.  HENT:     Head: Normocephalic and atraumatic.     Right Ear: Hearing normal.     Left Ear: Hearing normal.     Nose: Nose normal.  Eyes:     General: Lids are normal. No scleral icterus.       Right eye: No discharge.        Left eye: No discharge.     Conjunctiva/sclera: Conjunctivae normal.  Cardiovascular:     Rate and Rhythm: Normal rate and regular rhythm.     Heart sounds: Normal heart sounds.  Pulmonary:     Effort: Pulmonary effort is normal. No respiratory distress.     Breath sounds: Normal breath sounds.  Abdominal:     General: Bowel sounds are normal.     Palpations: Abdomen is soft.  Musculoskeletal:        General: Normal range of motion.     Cervical back: Neck supple.  Skin:    General: Skin is warm and dry.     Findings: No lesion or rash.  Neurological:     Mental Status: She is alert and oriented to person, place, and time.  Psychiatric:        Speech: Speech normal.        Behavior: Behavior normal.        Thought Content: Thought content normal.      Results for orders placed or performed in visit on 11/17/20  POCT urinalysis dipstick  Result Value Ref Range   Color, UA Yellow    Clarity, UA Clear    Glucose, UA Negative Negative   Bilirubin, UA Negative    Ketones, UA Negative    Spec Grav, UA 1.010 1.010 - 1.025   Blood, UA Negative    pH, UA 8.5 (A) 5.0 - 8.0   Protein, UA Negative Negative   Urobilinogen, UA 0.2 0.2 or 1.0 E.U./dL   Nitrite, UA Negative    Leukocytes, UA Negative Negative    Assessment & Plan     1. Generalized abdominal pain I am completely unsure of the etiology of patient's symptoms. At this time obtain lab work and ultrasound and will treat with Bentyl for possible GI symptoms. After ultrasound and labs we will have her follow-up in a couple  weeks. Examination of her hands where she has soreness that she states swelling reveals a normal exam of her hands - POCT urinalysis dipstick--Normal - CBC w/Diff/Platelet - Comprehensive Metabolic Panel (CMET) - Lipase - Sed Rate (ESR) - dicyclomine (BENTYL) 20 MG tablet; Take 1 tablet (20 mg total) by mouth 2 (two) times daily as needed for spasms.  Dispense: 60 tablet; Refill: 1 - US Pelvic Complete With Transvaginal  2. Need for influenza vaccination  - Flu Vaccine QUAD 63moIM (Fluarix, Fluzone & Alfiuria Quad PF)  3. History of cervical cancer Patient is status post partial hysterectomy.  States she has been getting yearly Pap smears - US Pelvic Complete With Transvaginal   4. Abnormal CT lung screening Not appearing pulmonary nodules but patient is a smoker. Consider appropriate follow-up  5. Anxiety In reviewing her entire chart Lorenz Coaster may be underlying for all of these issues.    Return in about 2 weeks (around 12/01/2020).      I, Wilhemena Durie, MD, have reviewed all documentation for this visit. The documentation on 11/23/20 for the exam, diagnosis, procedures, and orders are all accurate and complete.    Tashe Purdon Cranford Mon, MD  [facility] [phone] (phone) [phone] (fax)  [place]

## 2020-11-18 LAB — CBC WITH DIFFERENTIAL/PLATELET
Basophils Absolute: 0 10*3/uL (ref 0.0–0.2)
Basos: 0 %
EOS (ABSOLUTE): 0 10*3/uL (ref 0.0–0.4)
Eos: 0 %
Hematocrit: 46.7 % — ABNORMAL HIGH (ref 34.0–46.6)
Hemoglobin: 16.1 g/dL — ABNORMAL HIGH (ref 11.1–15.9)
Immature Grans (Abs): 0 10*3/uL (ref 0.0–0.1)
Immature Granulocytes: 1 %
Lymphocytes Absolute: 2.1 10*3/uL (ref 0.7–3.1)
Lymphs: 25 %
MCH: 31.8 pg (ref 26.6–33.0)
MCHC: 34.5 g/dL (ref 31.5–35.7)
MCV: 92 fL (ref 79–97)
Monocytes Absolute: 0.5 10*3/uL (ref 0.1–0.9)
Monocytes: 6 %
Neutrophils Absolute: 5.7 10*3/uL (ref 1.4–7.0)
Neutrophils: 68 %
Platelets: 225 10*3/uL (ref 150–450)
RBC: 5.07 x10E6/uL (ref 3.77–5.28)
RDW: 11.6 % — ABNORMAL LOW (ref 11.7–15.4)
WBC: 8.4 10*3/uL (ref 3.4–10.8)

## 2020-11-18 LAB — COMPREHENSIVE METABOLIC PANEL
ALT: 11 IU/L (ref 0–32)
AST: 11 IU/L (ref 0–40)
Albumin/Globulin Ratio: 1.9 (ref 1.2–2.2)
Albumin: 4.8 g/dL (ref 3.8–4.8)
Alkaline Phosphatase: 86 IU/L (ref 44–121)
BUN/Creatinine Ratio: 9 (ref 9–23)
BUN: 7 mg/dL (ref 6–24)
Bilirubin Total: 0.3 mg/dL (ref 0.0–1.2)
CO2: 26 mmol/L (ref 20–29)
Calcium: 9.9 mg/dL (ref 8.7–10.2)
Chloride: 102 mmol/L (ref 96–106)
Creatinine, Ser: 0.79 mg/dL (ref 0.57–1.00)
Globulin, Total: 2.5 g/dL (ref 1.5–4.5)
Glucose: 94 mg/dL (ref 70–99)
Potassium: 4.5 mmol/L (ref 3.5–5.2)
Sodium: 140 mmol/L (ref 134–144)
Total Protein: 7.3 g/dL (ref 6.0–8.5)
eGFR: 95 mL/min/{1.73_m2} (ref >59)

## 2020-11-18 LAB — LIPASE: Lipase: 97 U/L — ABNORMAL HIGH (ref 14–72)

## 2020-11-18 LAB — SEDIMENTATION RATE: Sed Rate: 4 mm/hr (ref 0–32)

## 2020-11-24 ENCOUNTER — Telehealth: Payer: Self-pay

## 2020-11-24 NOTE — Telephone Encounter (Signed)
Copied from [facility] [id number]. Topic: General - Other >> Nov 24, 2020  9:07 AM Tessa Lerner A wrote: Reason for CRM: The patient would like to be contacted by a member of staff to discuss their after visit summary for 11/17/20 with Dr. Rosanna Randy  Their were weight related concerns documented that the patient doesn't feel are accurate and would like to address further  Please contact to further discuss

## 2020-11-27 ENCOUNTER — Ambulatory Visit: Admission: RE | Admit: 2020-11-27 | Payer: Medicaid Other | Source: Ambulatory Visit

## 2020-11-27 ENCOUNTER — Ambulatory Visit
Admission: RE | Admit: 2020-11-27 | Discharge: 2020-11-27 | Disposition: A | Discharge status: Home / Self Care | Payer: Medicaid Other | Source: Ambulatory Visit | Attending: Family Medicine | Admitting: Family Medicine

## 2020-11-27 DIAGNOSIS — Z8541 Personal history of malignant neoplasm of cervix uteri: Secondary | ICD-10-CM | POA: Insufficient documentation

## 2020-11-27 DIAGNOSIS — R1084 Generalized abdominal pain: Secondary | ICD-10-CM | POA: Insufficient documentation

## 2020-11-27 DIAGNOSIS — Z90710 Acquired absence of both cervix and uterus: Secondary | ICD-10-CM | POA: Diagnosis not present

## 2020-12-05 ENCOUNTER — Ambulatory Visit: Payer: Medicaid Other | Admitting: Physician Assistant

## 2020-12-10 ENCOUNTER — Encounter: Payer: Self-pay | Admitting: Physician Assistant

## 2020-12-10 ENCOUNTER — Other Ambulatory Visit: Payer: Self-pay | Admitting: Physician Assistant

## 2020-12-10 ENCOUNTER — Ambulatory Visit (INDEPENDENT_AMBULATORY_CARE_PROVIDER_SITE_OTHER): Payer: Medicaid Other | Admitting: Physician Assistant

## 2020-12-10 ENCOUNTER — Other Ambulatory Visit: Payer: Self-pay

## 2020-12-10 VITALS — BP 138/88 | HR 92 | Ht 69.0 in | Wt 154.9 lb

## 2020-12-10 DIAGNOSIS — R1084 Generalized abdominal pain: Secondary | ICD-10-CM

## 2020-12-10 DIAGNOSIS — G4700 Insomnia, unspecified: Secondary | ICD-10-CM | POA: Diagnosis not present

## 2020-12-10 DIAGNOSIS — E782 Mixed hyperlipidemia: Secondary | ICD-10-CM | POA: Diagnosis not present

## 2020-12-10 DIAGNOSIS — M25541 Pain in joints of right hand: Secondary | ICD-10-CM

## 2020-12-10 DIAGNOSIS — M25542 Pain in joints of left hand: Secondary | ICD-10-CM

## 2020-12-10 MED ORDER — HYDROXYZINE PAMOATE 50 MG PO CAPS: ORAL_CAPSULE | ORAL | 3 refills | Status: DC

## 2020-12-10 NOTE — Assessment & Plan Note (Signed)
D/c trazodone-- pt self d/c as no longer working We can try Hydroxyzine 50 mg at bedtime instead, take 1 pill, can take up to 2 but try with just one first.  F/u 3 months

## 2020-12-10 NOTE — Progress Notes (Signed)
Established patient visit   Patient: Mikayla Walsh   DOB: 1975-06-05   45 y.o. Female  MRN: [id number] Visit Date: 12/10/2020  Today's healthcare provider: [name], PA-C   Chief Complaint  Patient presents with   Abdominal Pain   Subjective    HPI  Mikayla Walsh is a 45 y/o female who presents today for follow up of abdominal pain. She reports recurrent abdominal spasms/pain that occur every 5-6 weeks for the last year and only improve over time. Denies any associated changes in BM, bloody BM, nausea, or vomiting during these episodes. She has tried tylenol, advil, antacids, gas pills, heat, baths, ice, without relief.The last episode was 10/17 at her last visit here. CBC, CMP were WNL, lipase mildly elevated, ESR WNL. US abdomen WNL, US pelvic (history of uterine cancer) WNL.  Reports not picking up with dicyclomine prescription as she didn't feel it would help.  She is currently not in any pain.   She reports seeing many specialists in different states for a multitude of chronic symptoms that have been occurring on and off for the last 6 years: pain and swelling of the joints in her hands, numbness and tingling, muscle aches, leg fatigue, insomnia. We currently rx trazodone for insomnia, pt reports not taking in months as it stopped working. She is currently following with a neurologist as she felt MS could explain her symptoms.   History of hyperlipidemia, pt started simvastatin but d/c due to side effects: 'causes insides to be sore and squeezed, swelling in hands and joints'. She then started Zetia 7/22 and has been taking without issue.    Medications: Outpatient Medications Prior to Visit  Medication Sig   albuterol (VENTOLIN HFA) 108 (90 Base) MCG/ACT inhaler Inhale 2 puffs into the lungs every 6 (six) hours as needed for wheezing or shortness of breath.   ezetimibe (ZETIA) 10 MG tablet Take 1 tablet (10 mg total) by mouth daily.   Phenazopyridine HCl (PYRIDIUM PO) Take by  mouth as needed.   Vitamin D, Ergocalciferol, (DRISDOL) 1.25 MG (50000 UNIT) CAPS capsule Take 1 capsule (50,000 Units total) by mouth every 7 (seven) days. (taking one tablet per week) walk in lab in office 1-2 weeks after completing prescription.   dicyclomine (BENTYL) 20 MG tablet Take 1 tablet (20 mg total) by mouth 2 (two) times daily as needed for spasms. (Patient not taking: Reported on 12/10/2020)   [DISCONTINUED] clonazePAM (KLONOPIN) 0.5 MG tablet Take 1 tablet by mouth twice daily as needed for anxiety   [DISCONTINUED] OVER THE COUNTER MEDICATION Probiotic   [DISCONTINUED] topiramate (TOPAMAX) 100 MG tablet Take 1 tablet (100 mg total) by mouth daily.   [DISCONTINUED] traZODone (DESYREL) 50 MG tablet TAKE 3 TABLETS BY MOUTH AT BEDTIME   No facility-administered medications prior to visit.    Review of Systems  Constitutional:  Positive for fatigue. Negative for fever.  Respiratory:  Negative for shortness of breath.   Cardiovascular:  Negative for chest pain.  Gastrointestinal:  Positive for abdominal pain.  Musculoskeletal:  Positive for joint swelling and myalgias.  Skin:  Positive for color change.  Psychiatric/Behavioral:  Positive for sleep disturbance.   All other systems reviewed and are negative.    Objective    Blood pressure 138/88, pulse 92, height '5\' 9"'  (1.753 m), weight 154 lb 14.4 oz (70.3 kg), SpO2 97 %.   Physical Exam Constitutional:      General: She is awake.     Appearance: She is  well-developed.  HENT:     Head: Normocephalic.  Eyes:     Conjunctiva/sclera: Conjunctivae normal.  Cardiovascular:     Rate and Rhythm: Normal rate and regular rhythm.     Heart sounds: Normal heart sounds.  Pulmonary:     Effort: Pulmonary effort is normal.     Breath sounds: Normal breath sounds.  Skin:    General: Skin is warm.  Neurological:     Mental Status: She is alert and oriented to person, place, and time.  Psychiatric:        Attention and Perception:  Attention normal.        Mood and Affect: Mood normal.        Speech: Speech normal.        Behavior: Behavior is cooperative.     No results found for any visits on 12/10/20.  Assessment & Plan     Problem List Items Addressed This Visit       Other   Mixed hyperlipidemia - Primary    Check lipids and CMP when fasting to evaluate effectiveness of zetia F/u 6 mo       Relevant Orders   Lipid panel   Comprehensive Metabolic Panel (CMET)   Insomnia    D/c trazodone-- pt self d/c as no longer working We can try Hydroxyzine 50 mg at bedtime instead, take 1 pill, can take up to 2 but try with just one first.  F/u 3 months      Relevant Medications   hydrOXYzine (VISTARIL) 50 MG capsule   Generalized abdominal pain    Will discuss best GI to refer to with supervising physicians.  Discussed with patient GI is her best option to decide what possible diagnostic tests are needed, as all of our examinations came back WNL.  Pt did not take Dicyclomine--I described the MOA of this medication and how it can help with abdominal spasms, encouraged her to at least try if she has another episode.       Arthralgia of both hands    Will discuss which Rheumatologist is best to refer to with supervising physicians Discussed with patient that rheum bloodwork can be negative despite symptoms, this specialty may be best in deciding what diagnostic workup is most appropriate.        Return in about 3 months (around 03/12/2021) for insomnia.      I, [name], PA-C have reviewed all documentation for this visit. The documentation on  12/10/2020 for the exam, diagnosis, procedures, and orders are all accurate and complete.    [name], PA-C  [facility] [phone] (phone) [phone] (fax)  [place]

## 2020-12-10 NOTE — Assessment & Plan Note (Addendum)
Will discuss best GI to refer to with supervising physicians.  Discussed with patient GI is her best option to decide what possible diagnostic tests are needed, as all of our examinations came back WNL.  Pt did not take Dicyclomine--I described the MOA of this medication and how it can help with abdominal spasms, encouraged her to at least try if she has another episode.

## 2020-12-10 NOTE — Assessment & Plan Note (Signed)
Will discuss which Rheumatologist is best to refer to with supervising physicians Discussed with patient that rheum bloodwork can be negative despite symptoms, this specialty may be best in deciding what diagnostic workup is most appropriate.

## 2020-12-10 NOTE — Assessment & Plan Note (Addendum)
Check lipids and CMP when fasting to evaluate effectiveness of zetia F/u 6 mo

## 2020-12-28 ENCOUNTER — Telehealth: Payer: Medicaid Other | Admitting: Family Medicine

## 2020-12-28 DIAGNOSIS — R300 Dysuria: Secondary | ICD-10-CM

## 2020-12-29 MED ORDER — CIPROFLOXACIN HCL 250 MG PO TABS: 250.0000 mg | ORAL_TABLET | Freq: Two times a day (BID) | ORAL | 0 refills | Status: AC

## 2020-12-29 NOTE — Progress Notes (Signed)

## 2021-01-27 DIAGNOSIS — M2550 Pain in unspecified joint: Secondary | ICD-10-CM | POA: Diagnosis not present

## 2021-01-27 DIAGNOSIS — M545 Low back pain, unspecified: Secondary | ICD-10-CM | POA: Diagnosis not present

## 2021-01-27 DIAGNOSIS — M19041 Primary osteoarthritis, right hand: Secondary | ICD-10-CM | POA: Diagnosis not present

## 2021-01-27 DIAGNOSIS — G8929 Other chronic pain: Secondary | ICD-10-CM | POA: Diagnosis not present

## 2021-01-27 DIAGNOSIS — M19042 Primary osteoarthritis, left hand: Secondary | ICD-10-CM | POA: Insufficient documentation

## 2021-03-04 ENCOUNTER — Telehealth: Payer: Medicaid Other | Admitting: Nurse Practitioner

## 2021-03-04 DIAGNOSIS — R300 Dysuria: Secondary | ICD-10-CM | POA: Diagnosis not present

## 2021-03-04 MED ORDER — FLUCONAZOLE 150 MG PO TABS: 150.0000 mg | ORAL_TABLET | Freq: Once | ORAL | 0 refills | Status: AC

## 2021-03-04 MED ORDER — DOXYCYCLINE HYCLATE 100 MG PO TABS: 100.0000 mg | ORAL_TABLET | Freq: Two times a day (BID) | ORAL | 0 refills | Status: DC

## 2021-03-04 NOTE — Progress Notes (Signed)

## 2021-03-22 ENCOUNTER — Telehealth: Payer: Medicaid Other | Admitting: Family

## 2021-03-22 DIAGNOSIS — R21 Rash and other nonspecific skin eruption: Secondary | ICD-10-CM

## 2021-03-22 NOTE — Progress Notes (Signed)
E Visit for Rash  We are sorry that you are not feeling well. Here is how we plan to help!    Based on what you shared with me you may have a virus that is causing the rash .  Avoid contact with pregnant women until a diagnosis is made.  Most viral rashes are contagious (especially if a fever is present).  You can return to work or school after the rash is gone or when your doctor says it is safe to return with the rash.   I recommend taking a COVID test to rule out.    HOME CARE:  Take cool showers and avoid direct sunlight. Apply cool compress or wet dressings. Take a bath in an oatmeal bath.  Sprinkle content of one Aveeno packet under running faucet with comfortably warm water.  Bathe for 15-20 minutes, 1-2 times daily.  Pat dry with a towel. Do not rub the rash. Use hydrocortisone cream. Take an antihistamine like Benadryl for widespread rashes that itch.  The adult dose of Benadryl is 25-50 mg by mouth 4 times daily. Caution:  This type of medication may cause sleepiness.  Do not drink alcohol, drive, or operate dangerous machinery while taking antihistamines.  Do not take these medications if you have prostate enlargement.  Read package instructions thoroughly on all medications that you take.  GET HELP RIGHT AWAY IF:  Symptoms don't go away after treatment. Severe itching that persists. If you rash spreads or swells. If you rash begins to smell. If it blisters and opens or develops a yellow-brown crust. You develop a fever. You have a sore throat. You become short of breath.  MAKE SURE YOU:  Understand these instructions. Will watch your condition. Will get help right away if you are not doing well or get worse.  Thank you for choosing an e-visit.  Your e-visit answers were reviewed by a board certified advanced clinical practitioner to complete your personal care plan. Depending upon the condition, your plan could have included both over the counter or prescription  medications.  Please review your pharmacy choice. Make sure the pharmacy is open so you can pick up prescription now. If there is a problem, you may contact your provider through [employer] and have the prescription routed to another pharmacy.  Your safety is important to [place]. If you have drug allergies check your prescription carefully.   For the next 24 hours you can use MyChart to ask questions about today's visit, request a non-urgent call back, or ask for a work or school excuse. You will get an email in the next two days asking about your experience. I hope that your e-visit has been valuable and will speed your recovery.   Approximately 5 minutes was spent documenting and reviewing patient's chart.

## 2021-04-07 ENCOUNTER — Other Ambulatory Visit: Payer: Self-pay | Admitting: Physician Assistant

## 2021-04-07 DIAGNOSIS — G4700 Insomnia, unspecified: Secondary | ICD-10-CM

## 2021-04-21 NOTE — Progress Notes (Signed)
?  ? ?I,Mikayla Walsh,acting as a Education administrator for [employer], PA-C.,have documented all relevant documentation on the behalf of Mikayla Kirschner, PA-C,as directed by  Mikayla Kirschner, PA-C while in the presence of Mikayla Kirschner, PA-C. ? ? ?Established patient visit ? ? ?Patient: Mikayla Walsh   DOB: February 21, 1975   46 y.o. Female  MRN: [id number] ?Visit Date: 04/22/2021 ? ?Today's healthcare provider: [name], PA-C  ? ?Chief Complaint  ?Patient presents with  ? Insomnia  ? Hyperlipidemia  ? ?Subjective  ?  ?HPI  ? ?[name] reports a vague center of chest tightness shes noticed on and off for 'a while'. Denies any association with stress, activity, eating, breathing. Tender to touch. Denies associated radiation of pain, diaphoresis, heartburn, heart palpitations.  ? ?Follow up for insomnia ? ?The patient was last seen for this 3-4 months ago. ?Changes made at last visit include We can try Hydroxyzine 50 mg at bedtime instead, take 1 pill, can take up to 2 but try with just one first. ? ?She reports excellent compliance with treatment. Patient reports taking one tablet most nights. Patient reports it takes about 3-4 hours to sleep with medication. And she does wake up at night and then takes a while to go back to sleep. She is unsure if it is actually helping. ?Reports continued stress--son and daughter recently ill. Taking care of sick family members.  ?Previously failed trazodone and klonopin.  ? ?She feels that condition is Unchanged. ?She is having side effects. Weight gain ? ? ?-----------------------------------------------------------------------------------------  ?Lipid/Cholesterol, Follow-up ? ?Last lipid panel Other pertinent labs  ?Lab Results  ?Component Value Date  ? CHOL 251 (H) 07/29/2020  ? HDL 42 07/29/2020  ? LDLCALC 168 (H) 07/29/2020  ? TRIG 219 (H) 07/29/2020  ? CHOLHDL 6.0 (H) 07/29/2020  ? Lab Results  ?Component Value Date  ? ALT 11 11/17/2020  ? AST 11 11/17/2020  ? PLT 225 11/17/2020  ? TSH  0.772 07/29/2020  ?  ? ?She was last seen for this 3-4 months ago.  ?Management since that visit includes continue Zetia. ? ?She reports excellent compliance with treatment. ?She is having side effects. Weight gain ? ?Symptoms: ?No chest pain Yes chest pressure/discomfort  ?No dyspnea Yes lower extremity edema  ?Yes numbness or tingling of extremity No orthopnea  ?No palpitations No paroxysmal nocturnal dyspnea  ?No speech difficulty No syncope  ? ?Current diet:  mediterranean ?Current exercise:  exercise bike 5-20 miles a day ? ?The 10-year ASCVD risk score (Arnett DK, et al., 2019) is: 3.6% ? ?---------------------------------------------------------------------------------------------------  ?Medications: ?Outpatient Medications Prior to Visit  ?Medication Sig  ? albuterol (VENTOLIN HFA) 108 (90 Base) MCG/ACT inhaler Inhale 2 puffs into the lungs every 6 (six) hours as needed for wheezing or shortness of breath.  ? doxycycline (VIBRA-TABS) 100 MG tablet Take 1 tablet (100 mg total) by mouth 2 (two) times daily. 1 po bid  ? ezetimibe (ZETIA) 10 MG tablet Take 1 tablet (10 mg total) by mouth daily.  ? Phenazopyridine HCl (PYRIDIUM PO) Take by mouth as needed.  ? Vitamin D, Ergocalciferol, (DRISDOL) 1.25 MG (50000 UNIT) CAPS capsule Take 1 capsule (50,000 Units total) by mouth every 7 (seven) days. (taking one tablet per week) walk in lab in office 1-2 weeks after completing prescription.  ? [DISCONTINUED] hydrOXYzine (VISTARIL) 50 MG capsule Take 1 capsule by mouth at bedtime  ? [DISCONTINUED] dicyclomine (BENTYL) 20 MG tablet Take 1 tablet (20 mg total) by mouth 2 (two) times daily  as needed for spasms. (Patient not taking: Reported on 12/10/2020)  ? ?No facility-administered medications prior to visit.  ? ? ?Review of Systems  ?Constitutional:  Positive for unexpected weight change. Negative for appetite change and fatigue.  ?Respiratory:  Positive for chest tightness. Negative for cough, shortness of breath and  wheezing.   ?Cardiovascular:  Positive for leg swelling. Negative for chest pain and palpitations.  ? ?  ?  Objective  ?  ?BP (!) 97/56 (BP Location: Left Arm, Patient Position: Sitting, Cuff Size: Large)   Pulse 96   Temp 98.6 ?F (37 ?C) (Oral)   Resp 16   Ht '5\' 9"'$  (1.753 m)   Wt 167 lb (75.8 kg)   SpO2 100%   BMI 24.66 kg/m?  ?BP Readings from Last 3 Encounters:  ?04/22/21 (!) 97/56  ?12/10/20 138/88  ?11/17/20 129/88  ? ?Wt Readings from Last 3 Encounters:  ?04/22/21 167 lb (75.8 kg)  ?12/10/20 154 lb 14.4 oz (70.3 kg)  ?11/17/20 149 lb (67.6 kg)  ? ?  ? ?Physical Exam ?Constitutional:   ?   General: She is awake.  ?   Appearance: She is well-developed.  ?HENT:  ?   Head: Normocephalic.  ?Eyes:  ?   Conjunctiva/sclera: Conjunctivae normal.  ?Cardiovascular:  ?   Rate and Rhythm: Normal rate and regular rhythm.  ?   Heart sounds: Normal heart sounds.  ?Pulmonary:  ?   Effort: Pulmonary effort is normal.  ?   Breath sounds: Normal breath sounds.  ?Skin: ?   General: Skin is warm.  ?Neurological:  ?   Mental Status: She is alert and oriented to person, place, and time.  ?Psychiatric:     ?   Attention and Perception: Attention normal.     ?   Mood and Affect: Mood normal.     ?   Speech: Speech normal.     ?   Behavior: Behavior is cooperative.  ?  ? ?No results found for any visits on 04/22/21. ? Assessment & Plan  ?  ? ?Problem List Items Addressed This Visit   ? ?  ? Other  ? Mixed hyperlipidemia  ?  Currently managed w/ zetia ?Will recheck lipid panel ?  ?  ? Relevant Orders  ? Lipid Profile  ? Insomnia - Primary  ?  Discussed at length anxiety and insomnia ?Advised she go back to therapy, to stress that her goal is to sleep ?Will place referral to psych.  ? ?Will order home sleep test to r/o sleep apnea, but low on ddx ? ?Discussed potential medications, including SSRIs for anxiety. ?Pt does not feel comfortable relying on a medication on a daily basis.  ?We can try Daridorexant 25 mg. Advised to take  30-45 minutes before she goes to sleep, and to have at least 7 hours dedicated to sleeping at a time. Explained goal is short term medication therapy, these are not medications we can rely on for years.  ?Last CMP w/ normal LFTs ?  ?  ? Relevant Medications  ? Daridorexant HCl 25 MG TABS  ? Other Relevant Orders  ? Home sleep test  ? Ambulatory referral to Psychology  ? Weight gain  ?  Likely secondary to stress/lack of sleep ?Will check A1c, TSH/T4 ?Will monitor ?  ?  ? Relevant Orders  ? HgB A1c  ? TSH + free T4  ? Chest pain  ?  No red flag signs, reproducible  ?Advised pt to monitor for  occurrence and potential relationships with food, anxiety, etc. ?Will monitor ?  ?  ? ? ?Return in about 4 weeks (around 05/20/2021) for insomnia. ? ? ?I, Mikayla Kirschner, PA-C have reviewed all documentation for this visit. The documentation on  04/22/2021 for the exam, diagnosis, procedures, and orders are all accurate and complete.  ? ?Mikayla Kirschner, PA-C ?[facility] ?[id number] ?[place], [place], [address] ?Office: [phone] ?Fax: [phone]  ? ?[facility]  ?

## 2021-04-22 ENCOUNTER — Other Ambulatory Visit: Payer: Self-pay

## 2021-04-22 ENCOUNTER — Encounter: Payer: Self-pay | Admitting: Physician Assistant

## 2021-04-22 ENCOUNTER — Ambulatory Visit: Payer: Medicaid Other | Admitting: Physician Assistant

## 2021-04-22 VITALS — BP 97/56 | HR 96 | Temp 98.6°F | Resp 16 | Ht 69.0 in | Wt 167.0 lb

## 2021-04-22 DIAGNOSIS — R079 Chest pain, unspecified: Secondary | ICD-10-CM | POA: Diagnosis not present

## 2021-04-22 DIAGNOSIS — R635 Abnormal weight gain: Secondary | ICD-10-CM

## 2021-04-22 DIAGNOSIS — G4700 Insomnia, unspecified: Secondary | ICD-10-CM | POA: Diagnosis not present

## 2021-04-22 DIAGNOSIS — E782 Mixed hyperlipidemia: Secondary | ICD-10-CM

## 2021-04-22 MED ORDER — DARIDOREXANT HCL 25 MG PO TABS: 25.0000 mg | ORAL_TABLET | Freq: Every evening | ORAL | 0 refills | Status: DC | PRN

## 2021-04-22 NOTE — Assessment & Plan Note (Signed)
Currently managed w/ zetia ?Will recheck lipid panel ?

## 2021-04-22 NOTE — Assessment & Plan Note (Addendum)
No red flag signs, reproducible  ?Advised pt to monitor for occurrence and potential relationships with food, anxiety, etc. ?Will monitor ?

## 2021-04-22 NOTE — Assessment & Plan Note (Addendum)
Discussed at length anxiety and insomnia ?Advised she go back to therapy, to stress that her goal is to sleep ?Will place referral to psych.  ? ?Will order home sleep test to r/o sleep apnea, but low on ddx ? ?Discussed potential medications, including SSRIs for anxiety. ?Pt does not feel comfortable relying on a medication on a daily basis.  ?We can try Daridorexant 25 mg. Advised to take 30-45 minutes before she goes to sleep, and to have at least 7 hours dedicated to sleeping at a time. Explained goal is short term medication therapy, these are not medications we can rely on for years.  ?Last CMP w/ normal LFTs ?

## 2021-04-22 NOTE — Assessment & Plan Note (Signed)
Likely secondary to stress/lack of sleep ?Will check A1c, TSH/T4 ?Will monitor ?

## 2021-04-23 ENCOUNTER — Other Ambulatory Visit: Payer: Self-pay

## 2021-04-23 ENCOUNTER — Telehealth: Payer: Self-pay | Admitting: Physician Assistant

## 2021-04-23 LAB — LIPID PANEL
Chol/HDL Ratio: 5.1 ratio — ABNORMAL HIGH (ref 0.0–4.4)
Cholesterol, Total: 205 mg/dL — ABNORMAL HIGH (ref 100–199)
HDL: 40 mg/dL (ref >39)
LDL Chol Calc (NIH): 131 mg/dL — ABNORMAL HIGH (ref 0–99)
Triglycerides: 188 mg/dL — ABNORMAL HIGH (ref 0–149)
VLDL Cholesterol Cal: 34 mg/dL (ref 5–40)

## 2021-04-23 LAB — HEMOGLOBIN A1C
Est. average glucose Bld gHb Est-mCnc: 108 mg/dL
Hgb A1c MFr Bld: 5.4 % (ref 4.8–5.6)

## 2021-04-23 LAB — TSH+FREE T4
Free T4: 1.11 ng/dL (ref 0.82–1.77)
TSH: 1 u[IU]/mL (ref 0.450–4.500)

## 2021-04-23 MED ORDER — EZETIMIBE 10 MG PO TABS: 10.0000 mg | ORAL_TABLET | Freq: Every day | ORAL | 1 refills | Status: DC

## 2021-04-23 NOTE — Telephone Encounter (Signed)
[facility] faxed refill request for the following medications: ? ?ezetimibe (ZETIA) 10 MG tablet  ? ?Please advise. ? ?

## 2021-04-28 ENCOUNTER — Encounter: Payer: Self-pay | Admitting: Physician Assistant

## 2021-05-08 ENCOUNTER — Other Ambulatory Visit: Payer: Self-pay | Admitting: Physician Assistant

## 2021-05-08 DIAGNOSIS — G4700 Insomnia, unspecified: Secondary | ICD-10-CM

## 2021-05-21 ENCOUNTER — Ambulatory Visit: Payer: Medicaid Other | Admitting: Physician Assistant

## 2021-06-01 ENCOUNTER — Other Ambulatory Visit: Payer: Self-pay | Admitting: Physician Assistant

## 2021-06-01 ENCOUNTER — Encounter: Payer: Self-pay | Admitting: Physician Assistant

## 2021-06-01 DIAGNOSIS — G4700 Insomnia, unspecified: Secondary | ICD-10-CM

## 2021-06-24 ENCOUNTER — Encounter: Payer: Self-pay | Admitting: Physician Assistant

## 2021-06-24 NOTE — Progress Notes (Unsigned)
I,[name],acting as a Education administrator for [employer], PA-C.,have documented all relevant documentation on the behalf of Mikayla Kirschner, PA-C,as directed by  Mikayla Kirschner, PA-C while in the presence of Mikayla Kirschner, PA-C.  MyChart Video Visit    Virtual Visit via Video Note   This visit type was conducted due to national recommendations for restrictions regarding the COVID-19 Pandemic (e.g. social distancing) in an effort to limit this patient's exposure and mitigate transmission in our community. This patient is at least at moderate risk for complications without adequate follow up. This format is felt to be most appropriate for this patient at this time. Physical exam was limited by quality of the video and audio technology used for the visit.   Patient location: home Provider location: [facility]  I discussed the limitations of evaluation and management by telemedicine and the availability of in person appointments. The patient expressed understanding and agreed to proceed.  Patient: Mikayla Walsh   DOB: 05-04-1975   46 y.o. Female  MRN: [id number] Visit Date: 06/25/2021  Today's healthcare provider: [name], PA-C   Cc. Anxiety/depression  Subjective    HPI   Mikayla Walsh is a 46 y/o female who presents today for acute increase in tearfulness, insomnia, anxiety secondary to family difficulties. Her daughter attempted suicide a month ago, her son has been in the hospital, and her father is terminally ill. She reports not trying the sleeping pill that was sent in.  Feels overwhelmed, will wake up in a panic.    06/25/2021    1:14 PM  GAD 7 : Generalized Anxiety Score  Nervous, Anxious, on Edge 3  Control/stop worrying 2  Worry too much - different things 3  Trouble relaxing 2  Restless 1  Easily annoyed or irritable 2  Afraid - awful might happen 1  Total GAD 7 Score 14  Anxiety Difficulty Not difficult at all        06/25/2021    1:13 PM 04/22/2021    10:10 AM 01/11/2020    9:49 AM  PHQ9 SCORE ONLY  PHQ-9 Total Score 9 0 0    Medications: Outpatient Medications Prior to Visit  Medication Sig   albuterol (VENTOLIN HFA) 108 (90 Base) MCG/ACT inhaler Inhale 2 puffs into the lungs every 6 (six) hours as needed for wheezing or shortness of breath.   Daridorexant HCl 25 MG TABS Take 25 mg by mouth at bedtime as needed.   ezetimibe (ZETIA) 10 MG tablet Take 1 tablet (10 mg total) by mouth daily.   Phenazopyridine HCl (PYRIDIUM PO) Take by mouth as needed.   Vitamin D, Ergocalciferol, (DRISDOL) 1.25 MG (50000 UNIT) CAPS capsule Take 1 capsule (50,000 Units total) by mouth every 7 (seven) days. (taking one tablet per week) walk in lab in office 1-2 weeks after completing prescription.   [DISCONTINUED] doxycycline (VIBRA-TABS) 100 MG tablet Take 1 tablet (100 mg total) by mouth 2 (two) times daily. 1 po bid   No facility-administered medications prior to visit.    Review of Systems  Constitutional:  Negative for fatigue and fever.  Respiratory:  Negative for cough and shortness of breath.   Cardiovascular:  Negative for chest pain and leg swelling.  Gastrointestinal:  Negative for abdominal pain.  Neurological:  Negative for dizziness and headaches.  Psychiatric/Behavioral:  Positive for sleep disturbance. The patient is nervous/anxious.      Objective    There were no vitals taken for this visit.     Assessment &  Plan     Problem List Items Addressed This Visit       Other   Acute stress reaction - Primary    Pt cancelled psych appt  Advised to reschedule Will rx lexapro 10 mg, daily.  Will rx xanax 0.25 mg 1-2 times daily prn severe anxiety/panic Do not take xanax and the daridorexant together. F/u 6 weeks       Relevant Medications   escitalopram (LEXAPRO) 10 MG tablet   ALPRAZolam (XANAX) 0.25 MG tablet     Return in about 6 weeks (around 08/06/2021) for depression, anxiety.     I discussed the assessment and  treatment plan with the patient. The patient was provided an opportunity to ask questions and all were answered. The patient agreed with the plan and demonstrated an understanding of the instructions.   The patient was advised to call back or seek an in-person evaluation if the symptoms worsen or if the condition fails to improve as anticipated.  I provided 15 minutes of non-face-to-face time during this encounter.  I, Mikayla Kirschner, PA-C have reviewed all documentation for this visit. The documentation on  06/25/2021 for the exam, diagnosis, procedures, and orders are all accurate and complete.  Mikayla Kirschner, PA-C [facility] [address] [place], [place], [address] Office: [phone] Fax: [place]

## 2021-06-25 ENCOUNTER — Encounter: Payer: Self-pay | Admitting: Physician Assistant

## 2021-06-25 ENCOUNTER — Telehealth (INDEPENDENT_AMBULATORY_CARE_PROVIDER_SITE_OTHER): Payer: Medicaid Other | Admitting: Physician Assistant

## 2021-06-25 DIAGNOSIS — F430 Acute stress reaction: Secondary | ICD-10-CM | POA: Diagnosis not present

## 2021-06-25 MED ORDER — ALPRAZOLAM 0.25 MG PO TABS: ORAL_TABLET | ORAL | 0 refills | Status: DC

## 2021-06-25 MED ORDER — ESCITALOPRAM OXALATE 10 MG PO TABS: 10.0000 mg | ORAL_TABLET | Freq: Every day | ORAL | 1 refills | Status: DC

## 2021-06-25 NOTE — Assessment & Plan Note (Signed)
Pt cancelled psych appt  Advised to reschedule Will rx lexapro 10 mg, daily.  Will rx xanax 0.25 mg 1-2 times daily prn severe anxiety/panic Do not take xanax and the daridorexant together. F/u 6 weeks

## 2021-06-26 ENCOUNTER — Ambulatory Visit: Payer: Medicaid Other | Admitting: Psychiatry

## 2021-07-03 ENCOUNTER — Encounter: Payer: Self-pay | Admitting: Physician Assistant

## 2021-08-06 ENCOUNTER — Telehealth: Payer: Self-pay

## 2021-08-06 NOTE — Telephone Encounter (Signed)
Lm for patient to see if she has had previous sleep study.

## 2021-08-07 ENCOUNTER — Ambulatory Visit (INDEPENDENT_AMBULATORY_CARE_PROVIDER_SITE_OTHER): Payer: Medicaid Other | Admitting: Primary Care

## 2021-08-07 ENCOUNTER — Encounter: Payer: Self-pay | Admitting: Primary Care

## 2021-08-07 VITALS — BP 118/66 | HR 96 | Temp 97.8°F | Ht 70.0 in | Wt 169.0 lb

## 2021-08-07 DIAGNOSIS — G4700 Insomnia, unspecified: Secondary | ICD-10-CM

## 2021-08-07 DIAGNOSIS — R0683 Snoring: Secondary | ICD-10-CM | POA: Diagnosis not present

## 2021-08-07 NOTE — Progress Notes (Signed)
$'@Patient'Q$  ID: Mikayla Walsh, female    DOB: [birth year], 46 y.o.   MRN: [id number]  Chief Complaint  Patient presents with   sleep consult    No prior sleep study- daytime sleepiness, loud snoring and restless sleep.    Referring provider: [name], PA-C  HPI: 46 year old female, never smoked.  Past medical history significant for anxiety and insomnia.  08/07/2021 Patient presents today for sleep consult. She was seen by family medicine back in March for insomnia and started on Daridorexant and ordered for sleep study. She has had a hard time getting this scheduled. She has tried and failed trazodone, Klonopin and hydralazine.  She is also on lexapro and xanax for anxiety. She was referred to psychiatry. Advised to return to therapy.   She has been told that she snores at night. Associated restless sleep and insomnia. It can take her several hours to fall asleep and she wakes up frequently.  She has not yet tried daridorexant, she was told to hold off until sleep study. She is under a lot of stress at home. She feels lexapro has helped some, less emotional/tearful. She will wake up in the middle of the night with panic attacks. She takes xanax every couple of days. She was unable to keep apt with counselor d/t her fathers illness, she needs to travel to [place] every other weekend.  She grinds her teeth at night. Her father has sleep apnea. No symptoms of narcolepsy, cataplexy or sleep walking.   Sleep questionnaire Symptoms- Snoring, restless sleep, insomnia, daytime fatigue  Previous sleep study- None Typical bedtime- 11pm-2am Time to fall asleep- 3-4 hours Nocturnal awakenings- 5-20 times Start of day- 7-8am Weight changes-20 lbs Do you operate heavy machinery-No Do you currently wear CPAP- No Do currently wear oxygen- No Epworth score-3  Allergies  Allergen Reactions   Cefdinir    Corticosteroids Hives and Swelling   Latex    Macrobid [Nitrofurantoin] Other (See Comments)     Chest pain   Morphine And Related Hives and Swelling   Penicillins Hives    Redness, itching   Prednisone Hives and Swelling   Simvastatin Swelling   Sulfa Antibiotics Hives    Itching and redness    Immunization History  Administered Date(s) Administered   Influenza,inj,Quad PF,6+ Mos 11/17/2020    Past Medical History:  Diagnosis Date   Allergy    Cancer ([place])    Insomnia    Neuromuscular disorder ([place])     Tobacco History: Social History   Tobacco Use  Smoking Status Every Day   Packs/day: 1.00   Years: 20.00   Total pack years: 20.00   Types: Cigarettes  Smokeless Tobacco Never  Tobacco Comments   7 cigarettes daily-08/07/2021   Ready to quit: Not Answered Counseling given: Not Answered Tobacco comments: 7 cigarettes daily-08/07/2021   Outpatient Medications Prior to Visit  Medication Sig Dispense Refill   albuterol (VENTOLIN HFA) 108 (90 Base) MCG/ACT inhaler Inhale 2 puffs into the lungs every 6 (six) hours as needed for wheezing or shortness of breath. 8 g 0   ALPRAZolam (XANAX) 0.25 MG tablet Take 1-2 times daily prn severe anxiety/panic attack 20 tablet 0   Daridorexant HCl 25 MG TABS Take 25 mg by mouth at bedtime as needed. 30 tablet 0   escitalopram (LEXAPRO) 10 MG tablet Take 1 tablet (10 mg total) by mouth daily. 90 tablet 1   ezetimibe (ZETIA) 10 MG tablet Take 1 tablet (10 mg total) by mouth daily. [place]  tablet 1   Phenazopyridine HCl (PYRIDIUM PO) Take by mouth as needed.     Vitamin D, Ergocalciferol, (DRISDOL) 1.25 MG (50000 UNIT) CAPS capsule Take 1 capsule (50,000 Units total) by mouth every 7 (seven) days. (taking one tablet per week) walk in lab in office 1-2 weeks after completing prescription. 12 capsule 0   No facility-administered medications prior to visit.    Review of Systems  Review of Systems  Constitutional:  Positive for fatigue.  HENT: Negative.    Respiratory: Negative.    Cardiovascular: Negative.     Physical Exam  BP  118/66 (BP Location: Left Arm, Cuff Size: Normal)   Pulse 96   Temp 97.8 F (36.6 C) (Temporal)   Ht '5\' 10"'$  (1.778 m)   Wt 169 lb (76.7 kg)   SpO2 98%   BMI 24.25 kg/m  Physical Exam Constitutional:      Appearance: Normal appearance.  HENT:     Head: Normocephalic and atraumatic.     Mouth/Throat:     Mouth: Mucous membranes are moist.     Pharynx: Oropharynx is clear.  Cardiovascular:     Rate and Rhythm: Normal rate and regular rhythm.  Pulmonary:     Effort: Pulmonary effort is normal.     Breath sounds: Normal breath sounds.  Musculoskeletal:        General: Normal range of motion.  Skin:    General: Skin is warm and dry.  Neurological:     General: No focal deficit present.     Mental Status: She is alert and oriented to person, place, and time. Mental status is at baseline.  Psychiatric:        Mood and Affect: Mood normal.        Thought Content: Thought content normal.        Judgment: Judgment normal.      Lab Results:  CBC    Component Value Date/Time   WBC 8.4 11/17/2020 1031   WBC 10.7 (H) 07/13/2020 1239   RBC 5.07 11/17/2020 1031   RBC 5.20 (H) 07/13/2020 1239   HGB 16.1 (H) 11/17/2020 1031   HCT 46.7 (H) 11/17/2020 1031   PLT 225 11/17/2020 1031   MCV 92 11/17/2020 1031   MCH 31.8 11/17/2020 1031   MCH 31.7 07/13/2020 1239   MCHC 34.5 11/17/2020 1031   MCHC 34.9 07/13/2020 1239   RDW 11.6 (L) 11/17/2020 1031   LYMPHSABS 2.1 11/17/2020 1031   MONOABS 0.6 07/13/2020 1239   EOSABS 0.0 11/17/2020 1031   BASOSABS 0.0 11/17/2020 1031    BMET    Component Value Date/Time   NA 140 11/17/2020 1031   K 4.5 11/17/2020 1031   CL 102 11/17/2020 1031   CO2 26 11/17/2020 1031   GLUCOSE 94 11/17/2020 1031   GLUCOSE 99 07/13/2020 1239   BUN 7 11/17/2020 1031   CREATININE 0.79 11/17/2020 1031   CALCIUM 9.9 11/17/2020 1031   GFRNONAA >60 07/13/2020 1239   GFRAA 96 01/11/2020 1053    BNP No results found for: "BNP"  ProBNP No results found  for: "PROBNP"  Imaging: No results found.   Assessment & Plan:   Loud snoring - Patient had symptoms of snoring, restless sleep, insomnia and daytime sleepiness.  Family history of sleep apnea.  Epworth 3. BMI 24. Needs home sleep study to evaluated for obstructive sleep apnea.  Reviewed risks of untreated sleep apnea including cardiac arrhythmias, stroke, pulmonary hypertension and diabetes.  We briefly reviewed treatment options  including weight loss, oral appliance, CPAP therapy or referral to ENT for possible surgical options.  Encouraged patient continue side sleeping position or elevate head of bed 30 degrees.  Advised against driving experiencing excessive daytime sleepiness or fatigue.  Follow-up in 6 weeks to review sleep study results.  If patient does not have obstructive sleep apnea focus will be on sleep hygiene and insomnia treatment.  Insomnia - Patient has difficulty falling and staying asleep.  She wakes up frequently at night.  Sleep is very restless. Insomnia is largely psychosocial but need to rule out OSA as underlying cause. She has tried and failed trazodone, klonopin or hydralazine.  She was recently prescribed Daridorexant but has not started.  Recommend first getting sleep study.  Consider lunesta.  Encourage patient return to behavioral therapy for counseling.  Advised she aim to get 6 to 8 hours of sleep a night.  Continue exercise daily.   Martyn Ehrich, NP 08/07/2021

## 2021-08-07 NOTE — Patient Instructions (Addendum)
Sleep apnea a period of 10 seconds or longer that you stop breathing at night  Mild sleep apnea is 5-15 apneic events on average an hour Moderate sleep apnea is 15-30 apneic events an hour Severe sleep apnea is greater than 30 events an hour  Risk of untreated sleep apnea include cardiac arrhythmias, stroke, pulmonary hypertension or diabetes  Treatment options include weight loss, side sleeping position, oral appliance, CPAP therapy or referral to ENT for possible surgical options  Recommendations Aim to get 6 to 8 hours of sleep at night Return to therapy if you have not already done so Encourage you get 20 to 30 minutes of exercise a day  Orders Home sleep study re: loud snoring  Follow-up 6 weeks virtual visit with Eustaquio Maize NP to review sleep study results and treatment options if needed    Insomnia Insomnia is a sleep disorder that makes it difficult to fall asleep or stay asleep. Insomnia can cause fatigue, low energy, difficulty concentrating, mood swings, and poor performance at work or school. There are three different ways to classify insomnia: Difficulty falling asleep. Difficulty staying asleep. Waking up too early in the morning. Any type of insomnia can be long-term (chronic) or short-term (acute). Both are common. Short-term insomnia usually lasts for 3 months or less. Chronic insomnia occurs at least three times a week for longer than 3 months. What are the causes? Insomnia may be caused by another condition, situation, or substance, such as: Having certain mental health conditions, such as anxiety and depression. Using caffeine, alcohol, tobacco, or drugs. Having gastrointestinal conditions, such as gastroesophageal reflux disease (GERD). Having certain medical conditions. These include: Asthma. Alzheimer's disease. Stroke. Chronic pain. An overactive thyroid gland (hyperthyroidism). Other sleep disorders, such as restless legs syndrome and sleep  apnea. Menopause. Sometimes, the cause of insomnia may not be known. What increases the risk? Risk factors for insomnia include: Gender. Females are affected more often than males. Age. Insomnia is more common as people get older. Stress and certain medical and mental health conditions. Lack of exercise. Having an irregular work schedule. This may include working night shifts and traveling between different time zones. What are the signs or symptoms? If you have insomnia, the main symptom is having trouble falling asleep or having trouble staying asleep. This may lead to other symptoms, such as: Feeling tired or having low energy. Feeling nervous about going to sleep. Not feeling rested in the morning. Having trouble concentrating. Feeling irritable, anxious, or depressed. How is this diagnosed? This condition may be diagnosed based on: Your symptoms and medical history. Your health care provider may ask about: Your sleep habits. Any medical conditions you have. Your mental health. A physical exam. How is this treated? Treatment for insomnia depends on the cause. Treatment may focus on treating an underlying condition that is causing the insomnia. Treatment may also include: Medicines to help you sleep. Counseling or therapy. Lifestyle adjustments to help you sleep better. Follow these instructions at home: Eating and drinking  Limit or avoid alcohol, caffeinated beverages, and products that contain nicotine and tobacco, especially close to bedtime. These can disrupt your sleep. Do not eat a large meal or eat spicy foods right before bedtime. This can lead to digestive discomfort that can make it hard for you to sleep. Sleep habits  Keep a sleep diary to help you and your health care provider figure out what could be causing your insomnia. Write down: When you sleep. When you wake up during the  night. How well you sleep and how rested you feel the next day. Any side effects of  medicines you are taking. What you eat and drink. Make your bedroom a dark, comfortable place where it is easy to fall asleep. Put up shades or blackout curtains to block light from outside. Use a white noise machine to block noise. Keep the temperature cool. Limit screen use before bedtime. This includes: Not watching TV. Not using your smartphone, tablet, or computer. Stick to a routine that includes going to bed and waking up at the same times every day and night. This can help you fall asleep faster. Consider making a quiet activity, such as reading, part of your nighttime routine. Try to avoid taking naps during the day so that you sleep better at night. Get out of bed if you are still awake after 15 minutes of trying to sleep. Keep the lights down, but try reading or doing a quiet activity. When you feel sleepy, go back to bed. General instructions Take over-the-counter and prescription medicines only as told by your health care provider. Exercise regularly as told by your health care provider. However, avoid exercising in the hours right before bedtime. Use relaxation techniques to manage stress. Ask your health care provider to suggest some techniques that may work well for you. These may include: Breathing exercises. Routines to release muscle tension. Visualizing peaceful scenes. Make sure that you drive carefully. Do not drive if you feel very sleepy. Keep all follow-up visits. This is important. Contact a health care provider if: You are tired throughout the day. You have trouble in your daily routine due to sleepiness. You continue to have sleep problems, or your sleep problems get worse. Get help right away if: You have thoughts about hurting yourself or someone else. Get help right away if you feel like you may hurt yourself or others, or have thoughts about taking your own life. Go to your nearest emergency room or: Call 911. Call the [facility]  at ([phone] or 988. This is open 24 hours a day. Text the Crisis Text Line at 380-037-9148. Summary Insomnia is a sleep disorder that makes it difficult to fall asleep or stay asleep. Insomnia can be long-term (chronic) or short-term (acute). Treatment for insomnia depends on the cause. Treatment may focus on treating an underlying condition that is causing the insomnia. Keep a sleep diary to help you and your health care provider figure out what could be causing your insomnia. This information is not intended to replace advice given to you by your health care provider. Make sure you discuss any questions you have with your health care provider. Document Revised: 12/29/2020 Document Reviewed: 12/29/2020 Elsevier Patient Education  [place].   Sleep Apnea Sleep apnea affects breathing during sleep. It causes breathing to stop for 10 seconds or more, or to become shallow. People with sleep apnea usually snore loudly. It can also increase the risk of: Heart attack. Stroke. Being very overweight (obese). Diabetes. Heart failure. Irregular heartbeat. High blood pressure. The goal of treatment is to help you breathe normally again. What are the causes?  The most common cause of this condition is a collapsed or blocked airway. There are three kinds of sleep apnea: Obstructive sleep apnea. This is caused by a blocked or collapsed airway. Central sleep apnea. This happens when the brain does not send the right signals to the muscles that control breathing. Mixed sleep apnea. This is a combination of obstructive  and central sleep apnea. What increases the risk? Being overweight. Smoking. Having a small airway. Being older. Being female. Drinking alcohol. Taking medicines to calm yourself (sedatives or tranquilizers). Having family members with the condition. Having a tongue or tonsils that are larger than normal. What are the signs or symptoms? Trouble staying asleep. Loud  snoring. Headaches in the morning. Waking up gasping. Dry mouth or sore throat in the morning. Being sleepy or tired during the day. If you are sleepy or tired during the day, you may also: Not be able to focus your mind (concentrate). Forget things. Get angry a lot and have mood swings. Feel sad (depressed). Have changes in your personality. Have less interest in sex, if you are female. Be unable to have an erection, if you are female. How is this treated?  Sleeping on your side. Using a medicine to get rid of mucus in your nose (decongestant). Avoiding the use of alcohol, medicines to help you relax, or certain pain medicines (narcotics). Losing weight, if needed. Changing your diet. Quitting smoking. Using a machine to open your airway while you sleep, such as: An oral appliance. This is a mouthpiece that shifts your lower jaw forward. A CPAP device. This device blows air through a mask when you breathe out (exhale). An EPAP device. This has valves that you put in each nostril. A BIPAP device. This device blows air through a mask when you breathe in (inhale) and breathe out. Having surgery if other treatments do not work. Follow these instructions at home: Lifestyle Make changes that your doctor recommends. Eat a healthy diet. Lose weight if needed. Avoid alcohol, medicines to help you relax, and some pain medicines. Do not smoke or use any products that contain nicotine or tobacco. If you need help quitting, ask your doctor. General instructions Take over-the-counter and prescription medicines only as told by your doctor. If you were given a machine to use while you sleep, use it only as told by your doctor. If you are having surgery, make sure to tell your doctor you have sleep apnea. You may need to bring your device with you. Keep all follow-up visits. Contact a doctor if: The machine that you were given to use during sleep bothers you or does not seem to be working. You  do not get better. You get worse. Get help right away if: Your chest hurts. You have trouble breathing in enough air. You have an uncomfortable feeling in your back, arms, or stomach. You have trouble talking. One side of your body feels weak. A part of your face is hanging down. These symptoms may be an emergency. Get help right away. Call your local emergency services (911 in the [place] Do not wait to see if the symptoms will go away. Do not drive yourself to the hospital. Summary This condition affects breathing during sleep. The most common cause is a collapsed or blocked airway. The goal of treatment is to help you breathe normally while you sleep. This information is not intended to replace advice given to you by your health care provider. Make sure you discuss any questions you have with your health care provider. Document Revised: 08/27/2020 Document Reviewed: 12/28/2019 Elsevier Patient Education  [place].

## 2021-08-07 NOTE — Telephone Encounter (Signed)
Spoke to patient.  She stated that she has not had previous sleep study.

## 2021-08-07 NOTE — Assessment & Plan Note (Addendum)
-   Patient had symptoms of snoring, restless sleep, insomnia and daytime sleepiness.  Family history of sleep apnea.  Epworth 3. BMI 24. Needs home sleep study to evaluated for obstructive sleep apnea.  Reviewed risks of untreated sleep apnea including cardiac arrhythmias, stroke, pulmonary hypertension and diabetes.  We briefly reviewed treatment options including weight loss, oral appliance, CPAP therapy or referral to ENT for possible surgical options.  Encouraged patient continue side sleeping position or elevate head of bed 30 degrees.  Advised against driving experiencing excessive daytime sleepiness or fatigue.  Follow-up in 6 weeks to review sleep study results.  If patient does not have obstructive sleep apnea focus will be on sleep hygiene and insomnia treatment.

## 2021-08-07 NOTE — Assessment & Plan Note (Addendum)
-   Patient has difficulty falling and staying asleep.  She wakes up frequently at night.  Sleep is very restless. Insomnia is largely psychosocial but need to rule out OSA as underlying cause. She has tried and failed trazodone, klonopin or hydralazine.  She was recently prescribed Daridorexant but has not started.  Recommend first getting sleep study.  Consider lunesta.  Encourage patient return to behavioral therapy for counseling.  Advised she aim to get 6 to 8 hours of sleep a night.  Continue exercise daily.

## 2021-08-07 NOTE — Progress Notes (Signed)
Reviewed and agree with assessment/plan.   Chesley Mires, MD [facility] Pulmonary/Critical Care 08/07/2021, 11:48 AM Pager:  [phone]

## 2021-08-10 NOTE — Progress Notes (Unsigned)
I,Mikayla Walsh,acting as a Education administrator for [employer], PA-C.,have documented all relevant documentation on the behalf of [name], PA-C,as directed by  [name], PA-C while in the presence of [name], PA-C.   Established patient visit   Patient: Mikayla Walsh   DOB: 1975-12-20   46 y.o. Female  MRN: [id number] Visit Date: 08/11/2021  Today's healthcare provider: [name], PA-C   No chief complaint on file.  Subjective    HPI  Anxiety, Follow-up  She was last seen for anxiety 6 weeks ago. Changes made at last visit include rx lexapro 10 mg, daily,rx xanax 0.25 mg 1-2 times daily prn severe anxiety/panic. Do not take xanax and the daridorexant together   She reports {excellent/good/fair/poor:19665} compliance with treatment. She reports {good/fair/poor:18685} tolerance of treatment. She {is/is not:21021397} having side effects. {document side effects if present:1}  She feels her anxiety is {Desc; severity:60313} and {improved/worse/unchanged:3041574} since last visit.  Symptoms: {Yes/No:20286} chest pain {Yes/No:20286} difficulty concentrating  {Yes/No:20286} dizziness {Yes/No:20286} fatigue  {Yes/No:20286} feelings of losing control {Yes/No:20286} insomnia  {Yes/No:20286} irritable {Yes/No:20286} palpitations  {Yes/No:20286} panic attacks {Yes/No:20286} racing thoughts  {Yes/No:20286} shortness of breath {Yes/No:20286} sweating  {Yes/No:20286} tremors/shakes    GAD-7 Results    06/25/2021    1:14 PM  GAD-7 Generalized Anxiety Disorder Screening Tool  1. Feeling Nervous, Anxious, or on Edge 3  2. Not Being Able to Stop or Control Worrying 2  3. Worrying Too Much About Different Things 3  4. Trouble Relaxing 2  5. Being So Restless it's Hard To Sit Still 1  6. Becoming Easily Annoyed or Irritable 2  7. Feeling Afraid As If Something Awful Might Happen 1  Total GAD-7 Score 14  Difficulty At Work, Home, or Getting  Along With Others? Not  difficult at all    PHQ-9 Scores    06/25/2021    1:13 PM 04/22/2021   10:10 AM 01/11/2020    9:49 AM  PHQ9 SCORE ONLY  PHQ-9 Total Score 9 0 0    ---------------------------------------------------------------------------------------------------   Medications: Outpatient Medications Prior to Visit  Medication Sig   albuterol (VENTOLIN HFA) 108 (90 Base) MCG/ACT inhaler Inhale 2 puffs into the lungs every 6 (six) hours as needed for wheezing or shortness of breath.   ALPRAZolam (XANAX) 0.25 MG tablet Take 1-2 times daily prn severe anxiety/panic attack   Daridorexant HCl 25 MG TABS Take 25 mg by mouth at bedtime as needed.   escitalopram (LEXAPRO) 10 MG tablet Take 1 tablet (10 mg total) by mouth daily.   ezetimibe (ZETIA) 10 MG tablet Take 1 tablet (10 mg total) by mouth daily.   Phenazopyridine HCl (PYRIDIUM PO) Take by mouth as needed.   Vitamin D, Ergocalciferol, (DRISDOL) 1.25 MG (50000 UNIT) CAPS capsule Take 1 capsule (50,000 Units total) by mouth every 7 (seven) days. (taking one tablet per week) walk in lab in office 1-2 weeks after completing prescription.   No facility-administered medications prior to visit.    Review of Systems  {Labs  Heme  Chem  Endocrine  Serology  Results Review (optional):23779}   Objective    There were no vitals taken for this visit. {Show previous vital signs (optional):23777}  Physical Exam  ***  No results found for any visits on 08/11/21.  Assessment & Plan     ***  No follow-ups on file.      {provider attestation***:1}   [name], PA-C  [facility] [phone] (phone) [phone] (fax)  [facility]

## 2021-08-11 ENCOUNTER — Encounter: Payer: Self-pay | Admitting: Physician Assistant

## 2021-08-11 ENCOUNTER — Other Ambulatory Visit: Payer: Self-pay | Admitting: Physician Assistant

## 2021-08-11 ENCOUNTER — Ambulatory Visit: Payer: Medicaid Other | Admitting: Physician Assistant

## 2021-08-11 VITALS — BP 125/84 | HR 95 | Ht 70.0 in | Wt 170.3 lb

## 2021-08-11 DIAGNOSIS — F430 Acute stress reaction: Secondary | ICD-10-CM | POA: Diagnosis not present

## 2021-08-11 DIAGNOSIS — R079 Chest pain, unspecified: Secondary | ICD-10-CM

## 2021-08-11 MED ORDER — ALPRAZOLAM 0.25 MG PO TABS: ORAL_TABLET | ORAL | 1 refills | Status: AC

## 2021-08-11 NOTE — Assessment & Plan Note (Signed)
Suspicion for GI etiology, advised pt try a pepcid/ tums when she feels this pain to see if sensation improves. Will monitor

## 2021-08-11 NOTE — Assessment & Plan Note (Addendum)
Improved on Lexapro 10 mg with PRN xanax. Continue. Advised pt we can always re-refer to psychology/psychiatry if needed. F/u 4-6 mo

## 2021-09-14 ENCOUNTER — Encounter: Payer: Self-pay | Admitting: Physician Assistant

## 2021-09-14 ENCOUNTER — Telehealth: Payer: Medicaid Other | Admitting: Family Medicine

## 2021-09-14 DIAGNOSIS — N898 Other specified noninflammatory disorders of vagina: Secondary | ICD-10-CM

## 2021-09-14 NOTE — Progress Notes (Signed)
[place]   UTI/vaginal discharge- PCP would like to see about getting a sample, and given multiple drug allergies this would be recommended.

## 2021-09-16 DIAGNOSIS — R300 Dysuria: Secondary | ICD-10-CM | POA: Diagnosis not present

## 2021-09-16 DIAGNOSIS — H66001 Acute suppurative otitis media without spontaneous rupture of ear drum, right ear: Secondary | ICD-10-CM | POA: Diagnosis not present

## 2021-10-12 ENCOUNTER — Other Ambulatory Visit: Payer: Self-pay | Admitting: Physician Assistant

## 2021-10-29 ENCOUNTER — Other Ambulatory Visit: Payer: Self-pay | Admitting: Physician Assistant

## 2021-10-29 DIAGNOSIS — F430 Acute stress reaction: Secondary | ICD-10-CM

## 2021-11-26 ENCOUNTER — Other Ambulatory Visit: Payer: Self-pay | Admitting: Physician Assistant

## 2021-11-26 DIAGNOSIS — F430 Acute stress reaction: Secondary | ICD-10-CM

## 2021-12-16 ENCOUNTER — Encounter: Payer: Medicaid Other | Admitting: Physician Assistant

## 2022-01-04 NOTE — Progress Notes (Unsigned)
I,Jan Olano R Kristalyn Bergstresser,acting as a Education administrator for [employer], PA-C.,have documented all relevant documentation on the behalf of [name], PA-C,as directed by  [name], PA-C while in the presence of [name], PA-C.  Complete physical exam   Patient: Mikayla Walsh   DOB: [birth year]   46 y.o. Female  MRN: [id number] Visit Date: 01/05/2022  Today's healthcare provider: [name], PA-C   Cc. Cpe, flank/abdominal pain.  Subjective    Mikayla Walsh is a 46 y.o. female who presents today for a complete physical exam.  She reports consuming a low fat diet. Home exercise routine includes calisthenics. She generally feels well. She reports sleeping poorly. She does have additional problems to discuss today.   Pt is experiencing right flank/upper right quadrant abdominal pain pain, she believes that she may have a kidney stone.  Reports pain is intermittent on her right upper abdomen and will go through to her back.  Feels different than her normal kidney stone.  She is urinating more frequently.  There is some associated nausea, patient unsure of if worsened with fattier meals.  Still has an intact gallbladder.  Mentally she feels relatively stable, still not sleeping but she is transitioning to a third shift job which she thinks will actually improve her sleep routine.  Her father is in hospice currently.  Past Medical History:  Diagnosis Date   Allergy    Cancer ([place])    Insomnia    Neuromuscular disorder [facility])    Past Surgical History:  Procedure Laterality Date   ABDOMINAL HYSTERECTOMY     cervisectomy  02/2016   CESAREAN SECTION     TUBAL LIGATION     Social History   Socioeconomic History   Marital status: Divorced    Spouse name: Not on file   Number of children: Not on file   Years of education: Not on file   Highest education level: Not on file  Occupational History   Not on file  Tobacco Use   Smoking status: Every Day    Packs/day: 1.00    Years:  20.00    Total pack years: 20.00    Types: Cigarettes   Smokeless tobacco: Never   Tobacco comments:    7 cigarettes daily-08/07/2021  Substance and Sexual Activity   Alcohol use: Yes    Alcohol/week: 1.0 standard drink of alcohol    Types: 1 Glasses of wine per week   Drug use: Never   Sexual activity: Yes  Other Topics Concern   Not on file  Social History Narrative   Lives in [place]; at home- 4 kids [9 to 22]; light smoker; no alcohol.    Social Determinants of Health   Financial Resource Strain: Not on file  Food Insecurity: Not on file  Transportation Needs: Not on file  Physical Activity: Not on file  Stress: Not on file  Social Connections: Not on file  Intimate Partner Violence: Not on file   Family Status  Relation Name Status   Mother  (Not Specified)   Father  (Not Specified)   Son  (Not Specified)   MGF  (Not Specified)   PGM  (Not Specified)   PGF  (Not Specified)   Ethlyn Daniels  (Not Specified)   Family History  Problem Relation Age of Onset   Hypertension Mother    Hypertension Father    Hyperlipidemia Father    Cancer Father        lung cancer   Asthma Son  Cancer Maternal Grandfather    Kidney disease Paternal Grandmother    Alzheimer's disease Paternal Grandfather    Breast cancer Paternal Aunt    Allergies  Allergen Reactions   Cefdinir    Corticosteroids Hives and Swelling   Latex    Macrobid [Nitrofurantoin] Other (See Comments)    Chest pain   Morphine And Related Hives and Swelling   Penicillins Hives    Redness, itching   Prednisone Hives and Swelling   Simvastatin Swelling   Sulfa Antibiotics Hives    Itching and redness    Patient Care Team: [name], PA-C as PCP - General (Physician Assistant)   Medications: Outpatient Medications Prior to Visit  Medication Sig   albuterol (VENTOLIN HFA) 108 (90 Base) MCG/ACT inhaler Inhale 2 puffs into the lungs every 6 (six) hours as needed for wheezing or shortness of breath.    ALPRAZolam (XANAX) 0.25 MG tablet Take 1-2 times daily prn severe anxiety/panic attack   escitalopram (LEXAPRO) 10 MG tablet Take 1 tablet by mouth once daily   ezetimibe (ZETIA) 10 MG tablet Take 1 tablet by mouth once daily   Phenazopyridine HCl (PYRIDIUM PO) Take by mouth as needed.   Vitamin D, Ergocalciferol, (DRISDOL) 1.25 MG (50000 UNIT) CAPS capsule Take 1 capsule (50,000 Units total) by mouth every 7 (seven) days. (taking one tablet per week) walk in lab in office 1-2 weeks after completing prescription.   [DISCONTINUED] Daridorexant HCl 25 MG TABS Take 25 mg by mouth at bedtime as needed.   No facility-administered medications prior to visit.    Review of Systems  Genitourinary:  Positive for flank pain and hematuria.    Objective    Blood pressure 119/81, pulse 78, temperature 98.3 F (36.8 C), temperature source Oral, height '5\' 8"'$  (1.727 m), weight 167 lb 11.2 oz (76.1 kg), SpO2 99 %.    Physical Exam Constitutional:      General: She is awake.     Appearance: She is well-developed. She is not ill-appearing.  HENT:     Head: Normocephalic.     Right Ear: Tympanic membrane normal.     Left Ear: Tympanic membrane normal.     Nose: Nose normal. No congestion or rhinorrhea.     Mouth/Throat:     Pharynx: No oropharyngeal exudate or posterior oropharyngeal erythema.  Eyes:     Conjunctiva/sclera: Conjunctivae normal.     Pupils: Pupils are equal, round, and reactive to light.  Neck:     Thyroid: No thyroid mass or thyromegaly.  Cardiovascular:     Rate and Rhythm: Normal rate and regular rhythm.     Heart sounds: Normal heart sounds.  Pulmonary:     Effort: Pulmonary effort is normal.     Breath sounds: Normal breath sounds.  Abdominal:     Palpations: Abdomen is soft.     Tenderness: There is abdominal tenderness in the right upper quadrant.  Musculoskeletal:     Right lower leg: No swelling. No edema.     Left lower leg: No swelling. No edema.  Lymphadenopathy:      Cervical: No cervical adenopathy.  Skin:    General: Skin is warm.  Neurological:     Mental Status: She is alert and oriented to person, place, and time.  Psychiatric:        Attention and Perception: Attention normal.        Mood and Affect: Mood normal.        Speech: Speech normal.  Behavior: Behavior normal. Behavior is cooperative.     Last depression screening scores    01/05/2022    3:30 PM 08/11/2021    8:08 AM 06/25/2021    1:13 PM  PHQ 2/9 Scores  PHQ - 2 Score 0 0 2  PHQ- 9 Score '1 6 9   '$ Last fall risk screening    01/05/2022    3:29 PM  [place] in the past year? 0  Number falls in past yr: 0  Injury with Fall? 0   Last Audit-C alcohol use screening    01/05/2022    3:30 PM  Alcohol Use Disorder Test (AUDIT)  1. How often do you have a drink containing alcohol? 0  2. How many drinks containing alcohol do you have on a typical day when you are drinking? 0  3. How often do you have six or more drinks on one occasion? 0  AUDIT-C Score 0   A score of 3 or more in women, and 4 or more in men indicates increased risk for alcohol abuse, EXCEPT if all of the points are from question 1   Results for orders placed or performed in visit on 01/05/22  POCT Urinalysis Dipstick  Result Value Ref Range   Color, UA dark yellow    Clarity, UA clear    Glucose, UA Negative Negative   Bilirubin, UA negative    Spec Grav, UA 1.025 1.010 - 1.025   Blood, UA negative    pH, UA 6.0 5.0 - 8.0   Protein, UA Positive (A) Negative   Urobilinogen, UA 1.0 0.2 or 1.0 E.U./dL   Nitrite, UA negative    Leukocytes, UA Negative Negative   Appearance     Odor      Assessment & Plan    Routine Health Maintenance and Physical Exam  Exercise Activities and Dietary recommendations --balanced diet high in fiber and protein, low in sugars, carbs, fats. --physical activity/exercise 30 minutes 3-5 times a week     Immunization History  Administered Date(s)  Administered   Influenza,inj,Quad PF,6+ Mos 11/17/2020, 01/05/2022    Health Maintenance  Topic Date Due   HIV Screening  Never done   Hepatitis C Screening  Never done   DTaP/Tdap/Td (1 - Tdap) Never done   MAMMOGRAM  05/30/2021   COLONOSCOPY (Pts 45-6yr Insurance coverage will need to be confirmed)  Never done   PAP SMEAR-Modifier  02/13/2022   COVID-19 Vaccine (1) 01/21/2022 (Originally 12/05/1980)   INFLUENZA VACCINE  Completed   HPV VACCINES  Aged Out    Discussed health benefits of physical activity, and encouraged her to engage in regular exercise appropriate for her age and condition.  Problem List Items Addressed This Visit       Other   Urinary frequency    Urine dip completed in office no blood seen, will send for culture      Relevant Orders   POCT Urinalysis Dipstick (Completed)   Elevated hemoglobin ([place]    Patient is still smoking likely etiology will repeat CBC      Relevant Orders   CBC w/Diff/Platelet   Mixed hyperlipidemia    Statin intolerance because swelling, patient managed on Zetia 10 mg Will repeat fasting lipids today      Relevant Orders   Lipid Profile   Insomnia    Patient failed trazodone Klonopin and hydroxyzine and daridorexant.  She is going to try to manage with lifestyle changes.  Acute stress reaction    Relatively stable on Lexapro 10 mg uses Xanax 0.25 mg as needed.      RUQ abdominal pain    Urine cultures ordered to rule out UTI Ordered KUB to rule out nephrolithiasis If KUB is within normal limits can order right upper quadrant ultrasound to rule out gallbladder etiology      Relevant Orders   Urine Culture   DG Abd 1 View   Magnesium   Other Visit Diagnoses     Annual physical exam    -  Primary   Relevant Orders   Comprehensive Metabolic Panel (CMET)   Breast cancer screening by mammogram       Relevant Orders   MM 3D SCREEN BREAST BILATERAL   Colon cancer screening       Relevant Orders    Ambulatory referral to Gastroenterology   Need for immunization against influenza       Relevant Orders   Flu Vaccine QUAD 58moIM (Fluarix, Fluzone & Alfiuria Quad PF) (Completed)        Return in about 6 months (around 07/07/2022) for depression, anxiety.     I, LMikey Kirschner PA-C have reviewed all documentation for this visit. The documentation on  01/05/2022 for the exam, diagnosis, procedures, and orders are all accurate and complete.  LMikey Kirschner PA-C [facility] [place] [address]Office: [phone]: [phone]

## 2022-01-05 ENCOUNTER — Encounter: Payer: Self-pay | Admitting: Physician Assistant

## 2022-01-05 ENCOUNTER — Ambulatory Visit (INDEPENDENT_AMBULATORY_CARE_PROVIDER_SITE_OTHER): Payer: Medicaid Other | Admitting: Physician Assistant

## 2022-01-05 VITALS — BP 119/81 | HR 78 | Temp 98.3°F | Ht 68.0 in | Wt 167.7 lb

## 2022-01-05 DIAGNOSIS — Z1231 Encounter for screening mammogram for malignant neoplasm of breast: Secondary | ICD-10-CM

## 2022-01-05 DIAGNOSIS — D582 Other hemoglobinopathies: Secondary | ICD-10-CM

## 2022-01-05 DIAGNOSIS — E782 Mixed hyperlipidemia: Secondary | ICD-10-CM

## 2022-01-05 DIAGNOSIS — R1011 Right upper quadrant pain: Secondary | ICD-10-CM | POA: Diagnosis not present

## 2022-01-05 DIAGNOSIS — Z23 Encounter for immunization: Secondary | ICD-10-CM | POA: Diagnosis not present

## 2022-01-05 DIAGNOSIS — R350 Frequency of micturition: Secondary | ICD-10-CM

## 2022-01-05 DIAGNOSIS — F430 Acute stress reaction: Secondary | ICD-10-CM

## 2022-01-05 DIAGNOSIS — G4700 Insomnia, unspecified: Secondary | ICD-10-CM | POA: Diagnosis not present

## 2022-01-05 DIAGNOSIS — Z0000 Encounter for general adult medical examination without abnormal findings: Secondary | ICD-10-CM | POA: Diagnosis not present

## 2022-01-05 DIAGNOSIS — Z1211 Encounter for screening for malignant neoplasm of colon: Secondary | ICD-10-CM

## 2022-01-05 LAB — POCT URINALYSIS DIPSTICK
Bilirubin, UA: NEGATIVE
Blood, UA: NEGATIVE
Glucose, UA: NEGATIVE
Leukocytes, UA: NEGATIVE
Nitrite, UA: NEGATIVE
Protein, UA: POSITIVE — AB
Spec Grav, UA: 1.025 (ref 1.010–1.025)
Urobilinogen, UA: 1 E.U./dL
pH, UA: 6 (ref 5.0–8.0)

## 2022-01-05 NOTE — Assessment & Plan Note (Signed)
Patient is still smoking likely etiology will repeat CBC

## 2022-01-05 NOTE — Assessment & Plan Note (Signed)
Patient failed trazodone Klonopin and hydroxyzine and daridorexant.  She is going to try to manage with lifestyle changes.

## 2022-01-05 NOTE — Assessment & Plan Note (Signed)
Relatively stable on Lexapro 10 mg uses Xanax 0.25 mg as needed.

## 2022-01-05 NOTE — Assessment & Plan Note (Signed)
Urine cultures ordered to rule out UTI Ordered KUB to rule out nephrolithiasis If KUB is within normal limits can order right upper quadrant ultrasound to rule out gallbladder etiology

## 2022-01-05 NOTE — Assessment & Plan Note (Signed)
Statin intolerance because swelling, patient managed on Zetia 10 mg Will repeat fasting lipids today

## 2022-01-05 NOTE — Assessment & Plan Note (Signed)
Urine dip completed in office no blood seen, will send for culture

## 2022-01-06 ENCOUNTER — Ambulatory Visit
Admission: RE | Admit: 2022-01-06 | Discharge: 2022-01-06 | Disposition: A | Discharge status: Home / Self Care | Payer: Medicaid Other | Attending: Physician Assistant | Admitting: Physician Assistant

## 2022-01-06 ENCOUNTER — Ambulatory Visit
Admission: RE | Admit: 2022-01-06 | Discharge: 2022-01-06 | Disposition: A | Discharge status: Home / Self Care | Payer: Medicaid Other | Source: Ambulatory Visit | Attending: Physician Assistant | Admitting: Physician Assistant

## 2022-01-06 ENCOUNTER — Encounter: Payer: Self-pay | Admitting: Physician Assistant

## 2022-01-06 DIAGNOSIS — R1011 Right upper quadrant pain: Secondary | ICD-10-CM | POA: Insufficient documentation

## 2022-01-06 DIAGNOSIS — N2889 Other specified disorders of kidney and ureter: Secondary | ICD-10-CM | POA: Diagnosis not present

## 2022-01-06 DIAGNOSIS — Z87442 Personal history of urinary calculi: Secondary | ICD-10-CM | POA: Diagnosis not present

## 2022-01-06 DIAGNOSIS — I878 Other specified disorders of veins: Secondary | ICD-10-CM | POA: Diagnosis not present

## 2022-01-06 DIAGNOSIS — R109 Unspecified abdominal pain: Secondary | ICD-10-CM | POA: Diagnosis not present

## 2022-01-06 LAB — CBC WITH DIFFERENTIAL/PLATELET
Basophils Absolute: 0 10*3/uL (ref 0.0–0.2)
Basos: 0 %
EOS (ABSOLUTE): 0.1 10*3/uL (ref 0.0–0.4)
Eos: 1 %
Hematocrit: 45 % (ref 34.0–46.6)
Hemoglobin: 15.6 g/dL (ref 11.1–15.9)
Immature Grans (Abs): 0 10*3/uL (ref 0.0–0.1)
Immature Granulocytes: 0 %
Lymphocytes Absolute: 3.7 10*3/uL — ABNORMAL HIGH (ref 0.7–3.1)
Lymphs: 28 %
MCH: 30.6 pg (ref 26.6–33.0)
MCHC: 34.7 g/dL (ref 31.5–35.7)
MCV: 88 fL (ref 79–97)
Monocytes Absolute: 0.7 10*3/uL (ref 0.1–0.9)
Monocytes: 6 %
Neutrophils Absolute: 8.4 10*3/uL — ABNORMAL HIGH (ref 1.4–7.0)
Neutrophils: 65 %
Platelets: 252 10*3/uL (ref 150–450)
RBC: 5.09 x10E6/uL (ref 3.77–5.28)
RDW: 12.3 % (ref 11.7–15.4)
WBC: 13 10*3/uL — ABNORMAL HIGH (ref 3.4–10.8)

## 2022-01-06 LAB — COMPREHENSIVE METABOLIC PANEL
ALT: 19 IU/L (ref 0–32)
AST: 18 IU/L (ref 0–40)
Albumin/Globulin Ratio: 1.8 (ref 1.2–2.2)
Albumin: 4.6 g/dL (ref 3.9–4.9)
Alkaline Phosphatase: 104 IU/L (ref 44–121)
BUN/Creatinine Ratio: 11 (ref 9–23)
BUN: 8 mg/dL (ref 6–24)
Bilirubin Total: 0.3 mg/dL (ref 0.0–1.2)
CO2: 24 mmol/L (ref 20–29)
Calcium: 9.1 mg/dL (ref 8.7–10.2)
Chloride: 101 mmol/L (ref 96–106)
Creatinine, Ser: 0.76 mg/dL (ref 0.57–1.00)
Globulin, Total: 2.5 g/dL (ref 1.5–4.5)
Glucose: 94 mg/dL (ref 70–99)
Potassium: 4.1 mmol/L (ref 3.5–5.2)
Sodium: 139 mmol/L (ref 134–144)
Total Protein: 7.1 g/dL (ref 6.0–8.5)
eGFR: 98 mL/min/{1.73_m2} (ref >59)

## 2022-01-06 LAB — LIPID PANEL
Chol/HDL Ratio: 6.1 ratio — ABNORMAL HIGH (ref 0.0–4.4)
Cholesterol, Total: 225 mg/dL — ABNORMAL HIGH (ref 100–199)
HDL: 37 mg/dL — ABNORMAL LOW (ref >39)
LDL Chol Calc (NIH): 150 mg/dL — ABNORMAL HIGH (ref 0–99)
Triglycerides: 208 mg/dL — ABNORMAL HIGH (ref 0–149)
VLDL Cholesterol Cal: 38 mg/dL (ref 5–40)

## 2022-01-06 LAB — MAGNESIUM: Magnesium: 2 mg/dL (ref 1.6–2.3)

## 2022-01-07 LAB — URINE CULTURE

## 2022-01-08 ENCOUNTER — Other Ambulatory Visit: Payer: Self-pay | Admitting: Physician Assistant

## 2022-01-08 DIAGNOSIS — R1011 Right upper quadrant pain: Secondary | ICD-10-CM

## 2022-01-11 ENCOUNTER — Telehealth: Payer: Self-pay

## 2022-01-11 ENCOUNTER — Other Ambulatory Visit: Payer: Self-pay

## 2022-01-11 DIAGNOSIS — Z1211 Encounter for screening for malignant neoplasm of colon: Secondary | ICD-10-CM

## 2022-01-11 MED ORDER — NA SULFATE-K SULFATE-MG SULF 17.5-3.13-1.6 GM/177ML PO SOLN: 354.0000 mL | Freq: Once | ORAL | 0 refills | Status: AC

## 2022-01-11 NOTE — Telephone Encounter (Signed)
Gastroenterology Pre-Procedure Review  Request Date: 04/27/2021 Requesting Physician: Dr. Marius Ditch  PATIENT REVIEW QUESTIONS: The patient responded to the following health history questions as indicated:    1. Are you having any GI issues? no 2. Do you have a personal history of Polyps? no 3. Do you have a family history of Colon Cancer or Polyps? Maternal grandfather had colon cancer 4. Diabetes Mellitus? no 5. Joint replacements in the past 12 months?no 6. Major health problems in the past 3 months?no 7. Any artificial heart valves, MVP, or defibrillator?no    MEDICATIONS & ALLERGIES:    Patient reports the following regarding taking any anticoagulation/antiplatelet therapy:   Plavix, Coumadin, Eliquis, Xarelto, Lovenox, Pradaxa, Brilinta, or Effient? no Aspirin? no  Patient confirms/reports the following medications:  Current Outpatient Medications  Medication Sig Dispense Refill   albuterol (VENTOLIN HFA) 108 (90 Base) MCG/ACT inhaler Inhale 2 puffs into the lungs every 6 (six) hours as needed for wheezing or shortness of breath. 8 g 0   ALPRAZolam (XANAX) 0.25 MG tablet Take 1-2 times daily prn severe anxiety/panic attack 20 tablet 1   escitalopram (LEXAPRO) 10 MG tablet Take 1 tablet by mouth once daily 90 tablet 1   ezetimibe (ZETIA) 10 MG tablet Take 1 tablet by mouth once daily 90 tablet 2   Phenazopyridine HCl (PYRIDIUM PO) Take by mouth as needed.     Vitamin D, Ergocalciferol, (DRISDOL) 1.25 MG (50000 UNIT) CAPS capsule Take 1 capsule (50,000 Units total) by mouth every 7 (seven) days. (taking one tablet per week) walk in lab in office 1-2 weeks after completing prescription. 12 capsule 0   No current facility-administered medications for this visit.    Patient confirms/reports the following allergies:  Allergies  Allergen Reactions   Cefdinir    Corticosteroids Hives and Swelling   Latex    Macrobid [Nitrofurantoin] Other (See Comments)    Chest pain   Morphine And  Related Hives and Swelling   Penicillins Hives    Redness, itching   Prednisone Hives and Swelling   Simvastatin Swelling   Sulfa Antibiotics Hives    Itching and redness    No orders of the defined types were placed in this encounter.   AUTHORIZATION INFORMATION Primary Insurance: 1D#: Group #:  Secondary Insurance: 1D#: Group #:  SCHEDULE INFORMATION: Date:  Time: Location:

## 2022-01-18 ENCOUNTER — Ambulatory Visit: Payer: Medicaid Other

## 2022-01-19 ENCOUNTER — Ambulatory Visit
Admission: RE | Admit: 2022-01-19 | Discharge: 2022-01-19 | Disposition: A | Discharge status: Home / Self Care | Payer: Medicaid Other | Source: Ambulatory Visit | Attending: Physician Assistant | Admitting: Physician Assistant

## 2022-01-19 DIAGNOSIS — R1011 Right upper quadrant pain: Secondary | ICD-10-CM | POA: Diagnosis not present

## 2022-02-11 IMAGING — MR MR HEAD W/O CM
11 series · 48 of 48 positions shown · non-contrast
Comparison: None.

CLINICAL DATA: Dizziness, left-sided numbness

EXAM:
MRI HEAD WITHOUT CONTRAST
TECHNIQUE: Multiplanar, multiecho pulse sequences of the brain and surrounding
structures were obtained without intravenous contrast.

[Series 5: ax dwi_tracew · axial · 3.0mm · 0.71mm/px · z∈[-95,+70]mm · 5 of 56 slices shown]
[im 1/56]
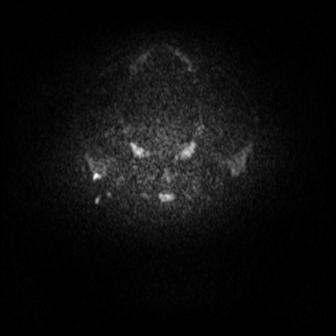
[im 14/56]
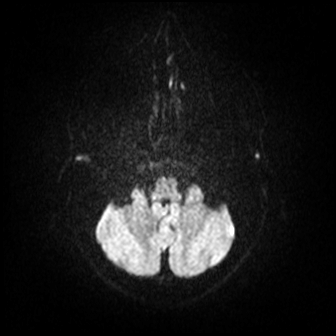
[im 28/56]
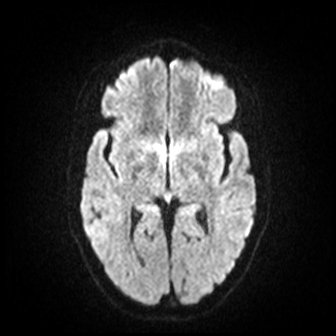
[im 42/56]
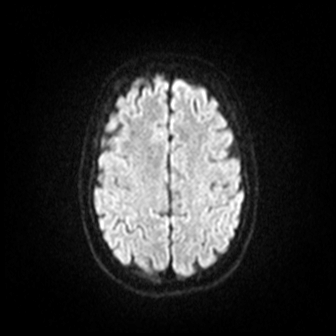
[im 56/56]
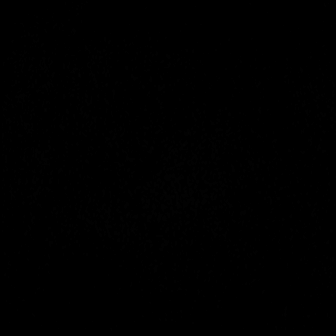

[Series 6: ax dwi_adc · axial · 3.0mm · 0.71mm/px · z∈[-95,+67]mm · 5 of 55 slices shown]
[im 1/55]
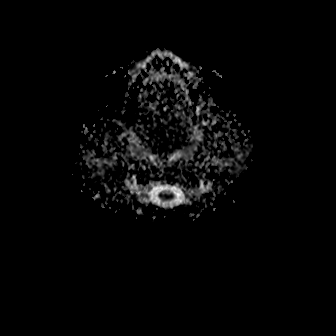
[im 14/55]
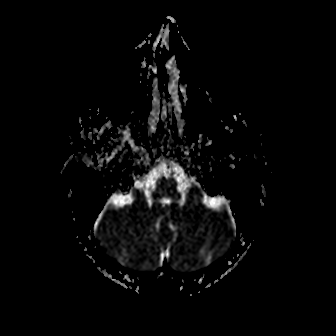
[im 28/55]
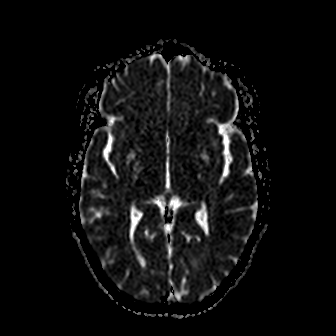
[im 41/55]
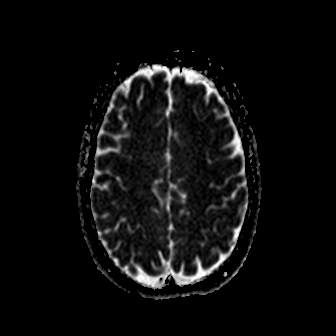
[im 55/55]
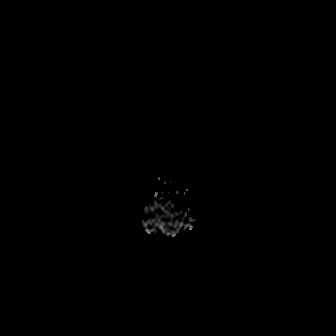

[Series 7: cor dwi_tracew · coronal · 5.0mm · 0.68mm/px · 3 of 40 slices shown]
[im 1/40]
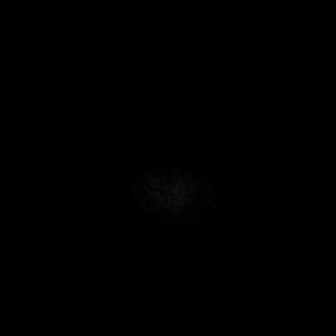
[im 20/40]
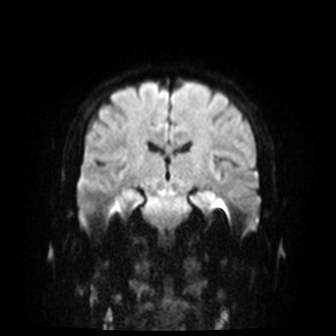
[im 40/40]
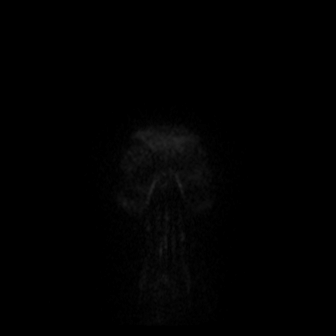

[Series 8: cor dwi_adc · coronal · 5.0mm · 0.68mm/px · 3 of 39 slices shown]
[im 1/39]
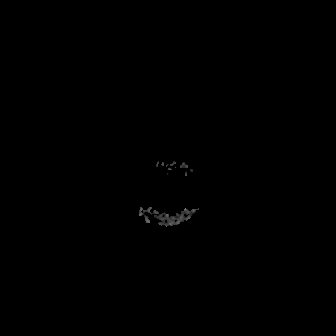
[im 20/39]
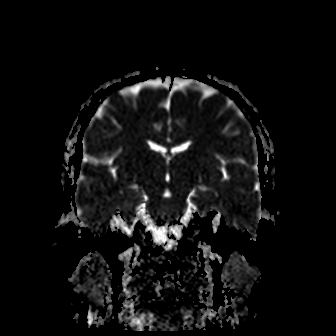
[im 39/39]
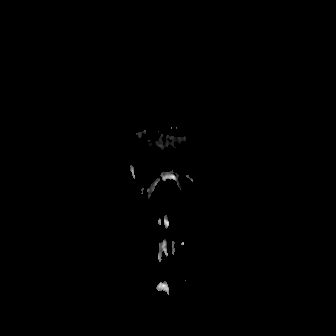

[Series 9: T1 · sagittal · 5.0mm · 0.47mm/px · 2 of 20 slices shown (1 of 2)]
[im 1/20]
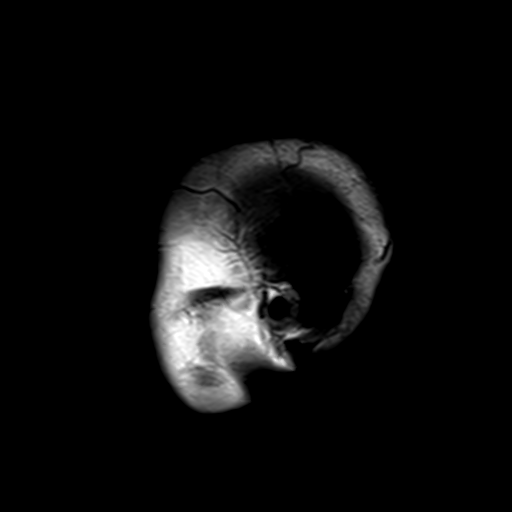
[im 20/20]
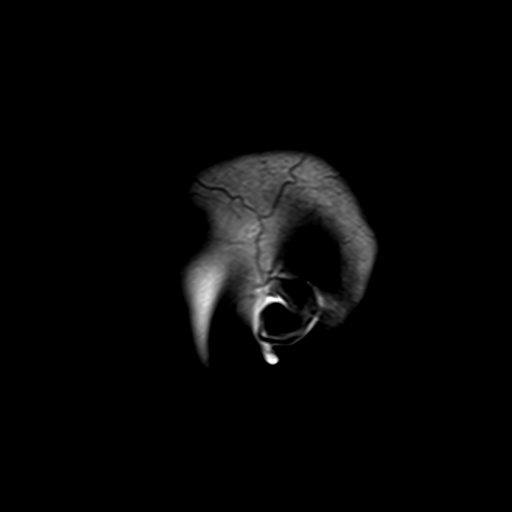

[Series 10: T2 · axial · 5.0mm · 0.86mm/px · z∈[-84,+71]mm · 2 of 27 slices shown (1 of 2)]
[im 1/27]
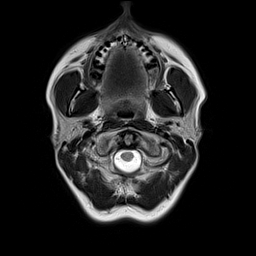
[im 27/27]
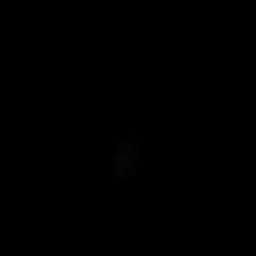

[Series 12: pha_images · axial · 3.0mm · 0.90mm/px · z∈[-82,+70]mm · 4 of 52 slices shown]
[im 1/52]
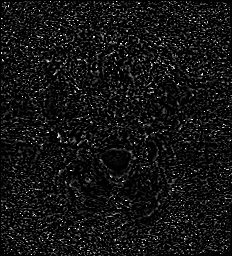
[im 18/52]
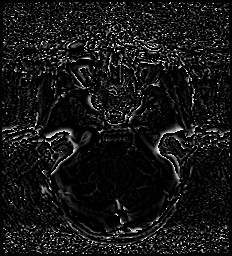
[im 35/52]
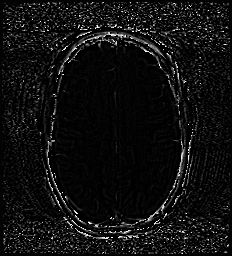
[im 52/52]
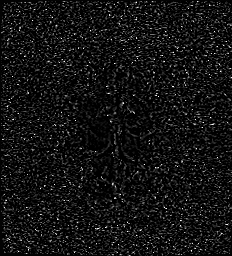

[Series 13: swi_images · axial · 3.0mm · 0.90mm/px · z∈[-82,+70]mm · 4 of 52 slices shown]
[im 1/52]
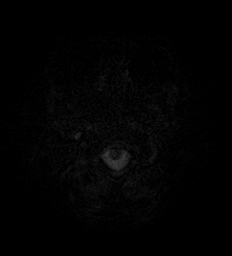
[im 18/52]
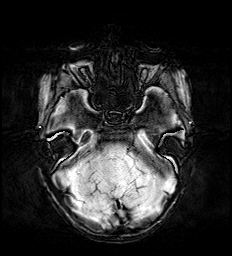
[im 35/52]
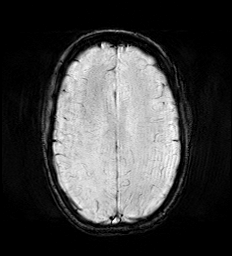
[im 52/52]
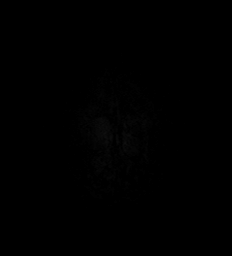

[Series 15: FLAIR · axial · 3.0mm · 0.69mm/px · z∈[-87,+71]mm · 4 of 54 slices shown]
[im 1/54]
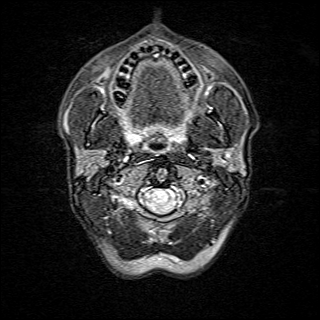
[im 18/54]
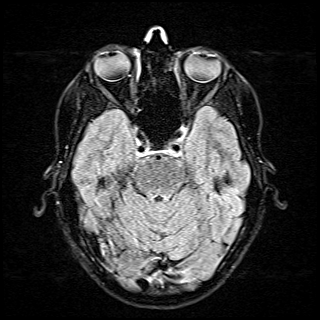
[im 36/54]
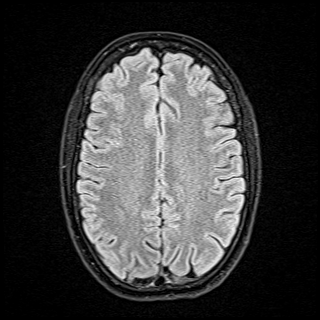
[im 54/54]
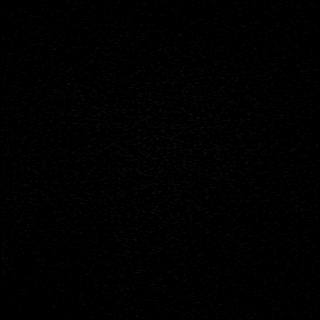

[Series 16: T1 · axial · 1.0mm · 0.98mm/px · z∈[-93,+82]mm · 14 of 176 slices shown (2 of 2)]
[im 1/176]
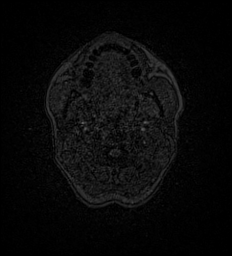
[im 14/176]
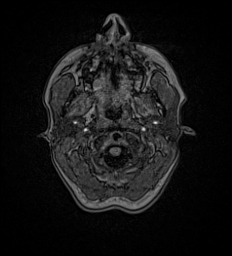
[im 27/176]
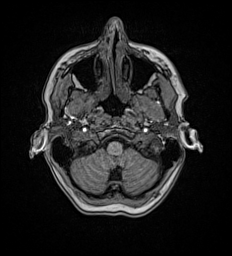
[im 41/176]
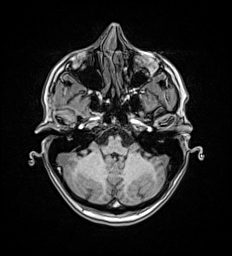
[im 54/176]
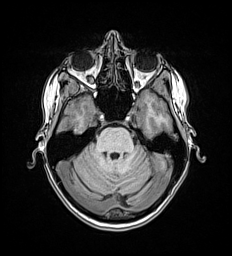
[im 68/176]
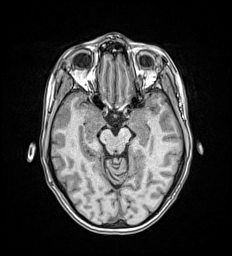
[im 81/176]
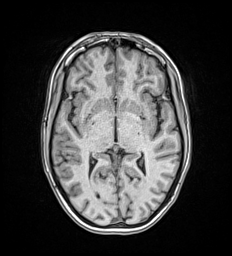
[im 95/176]
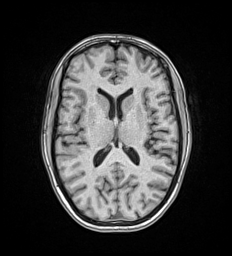
[im 108/176]
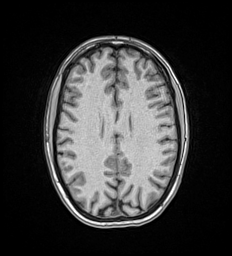
[im 122/176]
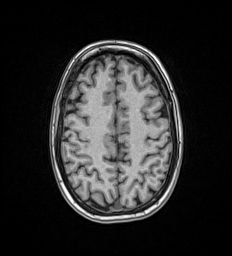
[im 135/176]
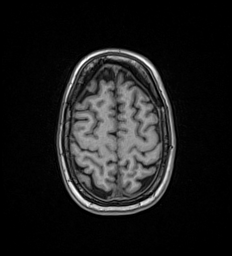
[im 149/176]
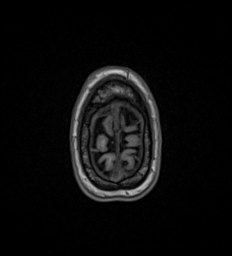
[im 162/176]
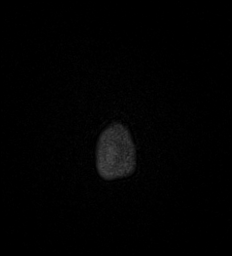
[im 176/176]
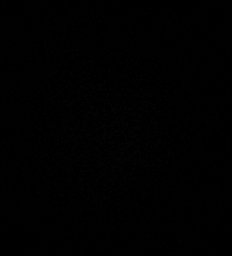

[Series 17: T2 · coronal · 5.0mm · 0.86mm/px · 2 of 30 slices shown (2 of 2)]
[im 1/30]
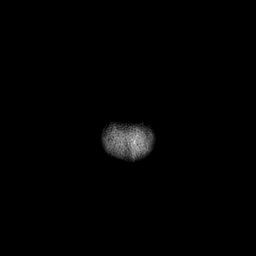
[im 30/30]
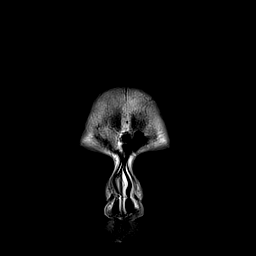

[48 of 48 positions shown; findings below may reference images not displayed]

FINDINGS: Brain: There is no acute infarction or intracranial hemorrhage.
There is no intracranial mass, mass effect, or edema. There is no
hydrocephalus or extra-axial fluid collection. Ventricles and sulci
are normal in size and configuration. Minimal small foci of T2
hyperintensity in the supratentorial white matter likely reflect
nonspecific gliosis/demyelination of doubtful clinical significance.

Vascular: Major vessel flow voids at the skull base are preserved.

Skull and upper cervical spine: Normal marrow signal is preserved.

Sinuses/Orbits: Paranasal sinuses are aerated. Orbits are
unremarkable.

Other: Sella is unremarkable.  Mastoid air cells are clear.
IMPRESSION: No evidence of recent infarction, hemorrhage, or mass.

## 2022-03-16 DIAGNOSIS — H6591 Unspecified nonsuppurative otitis media, right ear: Secondary | ICD-10-CM | POA: Diagnosis not present

## 2022-03-16 DIAGNOSIS — H6691 Otitis media, unspecified, right ear: Secondary | ICD-10-CM | POA: Diagnosis not present

## 2022-04-23 ENCOUNTER — Telehealth: Payer: Self-pay

## 2022-04-23 NOTE — Telephone Encounter (Signed)
Informed Trish of the change in day

## 2022-04-23 NOTE — Telephone Encounter (Addendum)
Tried to call patient back but mailbox was full unable to leave a message.

## 2022-04-23 NOTE — Telephone Encounter (Signed)
Patient left a voicemail on my phone yesterday afternoon to reschedule her colonoscopy.

## 2022-04-23 NOTE — Telephone Encounter (Signed)
Patient states she just loss her dad and needing to reschedule procedure a couple months. Reschedule procedure to 07/23/2022. Sent out new instructions to [employer] and mailed them

## 2022-05-02 DIAGNOSIS — H6691 Otitis media, unspecified, right ear: Secondary | ICD-10-CM | POA: Diagnosis not present

## 2022-05-19 NOTE — Progress Notes (Unsigned)
   Vivien Rota DeSanto,acting as a scribe for Alfredia Ferguson, PA-C.,have documented all relevant documentation on the behalf of Alfredia Ferguson, PA-C,as directed by  Alfredia Ferguson, PA-C while in the presence of Alfredia Ferguson, PA-C.     Established patient visit   Patient: Mikayla Walsh   DOB: 09/24/1975   47 y.o. Female  MRN: [id number] Visit Date: 05/20/2022  Today's healthcare provider: Alfredia Ferguson, PA-C   No chief complaint on file.  Subjective    HPI  ***  Medications: Outpatient Medications Prior to Visit  Medication Sig   albuterol (VENTOLIN HFA) 108 (90 Base) MCG/ACT inhaler Inhale 2 puffs into the lungs every 6 (six) hours as needed for wheezing or shortness of breath.   ALPRAZolam (XANAX) 0.25 MG tablet Take 1-2 times daily prn severe anxiety/panic attack   escitalopram (LEXAPRO) 10 MG tablet Take 1 tablet by mouth once daily   ezetimibe (ZETIA) 10 MG tablet Take 1 tablet by mouth once daily   Phenazopyridine HCl (PYRIDIUM PO) Take by mouth as needed.   Vitamin D, Ergocalciferol, (DRISDOL) 1.25 MG (50000 UNIT) CAPS capsule Take 1 capsule (50,000 Units total) by mouth every 7 (seven) days. (taking one tablet per week) walk in lab in office 1-2 weeks after completing prescription.   No facility-administered medications prior to visit.    Review of Systems  {Labs  Heme  Chem  Endocrine  Serology  Results Review (optional):23779}   Objective    There were no vitals taken for this visit. {Show previous vital signs (optional):23777}  Physical Exam  ***  No results found for any visits on 05/20/22.  Assessment & Plan     ***  No follow-ups on file.      {provider attestation***:1}   Alfredia Ferguson, PA-C  [facility] [phone] (phone) [phone] (fax)  [facility]

## 2022-05-20 ENCOUNTER — Encounter: Payer: Self-pay | Admitting: Physician Assistant

## 2022-05-20 ENCOUNTER — Ambulatory Visit: Payer: Medicaid Other | Admitting: Physician Assistant

## 2022-05-20 VITALS — BP 120/82 | HR 86 | Temp 98.6°F | Wt 167.0 lb

## 2022-05-20 DIAGNOSIS — Z114 Encounter for screening for human immunodeficiency virus [HIV]: Secondary | ICD-10-CM | POA: Diagnosis not present

## 2022-05-20 DIAGNOSIS — Z8541 Personal history of malignant neoplasm of cervix uteri: Secondary | ICD-10-CM | POA: Insufficient documentation

## 2022-05-20 DIAGNOSIS — D72829 Elevated white blood cell count, unspecified: Secondary | ICD-10-CM | POA: Diagnosis not present

## 2022-05-20 NOTE — Assessment & Plan Note (Signed)
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 Pt states since then normal paps. Advised she seek the opinion of a gyn surgeon for further pap screening

## 2022-05-20 NOTE — Patient Instructions (Signed)
Please contact [phone] to schedule your mammogram. They will ask which location you prefer to be seen in. You have two options listed below.  1) [facility] located at [address] [place], [place] [address] 2) [facility] [place] located at [address] [place], [place] [address]  Please feel free to contact us if you have any further questions or concerns  

## 2022-05-21 LAB — CBC WITH DIFFERENTIAL/PLATELET
Basophils Absolute: 0 10*3/uL (ref 0.0–0.2)
Basos: 0 %
EOS (ABSOLUTE): 0.1 10*3/uL (ref 0.0–0.4)
Eos: 1 %
Hematocrit: 44.1 % (ref 34.0–46.6)
Hemoglobin: 15 g/dL (ref 11.1–15.9)
Immature Grans (Abs): 0 10*3/uL (ref 0.0–0.1)
Immature Granulocytes: 0 %
Lymphocytes Absolute: 3.9 10*3/uL — ABNORMAL HIGH (ref 0.7–3.1)
Lymphs: 35 %
MCH: 30.4 pg (ref 26.6–33.0)
MCHC: 34 g/dL (ref 31.5–35.7)
MCV: 90 fL (ref 79–97)
Monocytes Absolute: 0.7 10*3/uL (ref 0.1–0.9)
Monocytes: 7 %
Neutrophils Absolute: 6.4 10*3/uL (ref 1.4–7.0)
Neutrophils: 57 %
Platelets: 273 10*3/uL (ref 150–450)
RBC: 4.93 x10E6/uL (ref 3.77–5.28)
RDW: 12.5 % (ref 11.7–15.4)
WBC: 11.2 10*3/uL — ABNORMAL HIGH (ref 3.4–10.8)

## 2022-05-21 LAB — HIV ANTIBODY (ROUTINE TESTING W REFLEX): HIV Screen 4th Generation wRfx: NONREACTIVE

## 2022-05-25 ENCOUNTER — Other Ambulatory Visit: Payer: Self-pay | Admitting: Physician Assistant

## 2022-05-25 ENCOUNTER — Encounter: Payer: Self-pay | Admitting: Physician Assistant

## 2022-05-25 DIAGNOSIS — D72829 Elevated white blood cell count, unspecified: Secondary | ICD-10-CM

## 2022-05-31 ENCOUNTER — Inpatient Hospital Stay (HOSPITAL_BASED_OUTPATIENT_CLINIC_OR_DEPARTMENT_OTHER): Payer: Medicaid Other | Admitting: Nurse Practitioner

## 2022-05-31 ENCOUNTER — Other Ambulatory Visit: Payer: Self-pay

## 2022-05-31 ENCOUNTER — Inpatient Hospital Stay: Payer: Medicaid Other | Attending: Nurse Practitioner

## 2022-05-31 ENCOUNTER — Encounter: Payer: Self-pay | Admitting: Nurse Practitioner

## 2022-05-31 VITALS — BP 140/96 | HR 98 | Temp 98.1°F | Wt 169.0 lb

## 2022-05-31 DIAGNOSIS — Z90710 Acquired absence of both cervix and uterus: Secondary | ICD-10-CM | POA: Insufficient documentation

## 2022-05-31 DIAGNOSIS — Z79899 Other long term (current) drug therapy: Secondary | ICD-10-CM | POA: Insufficient documentation

## 2022-05-31 DIAGNOSIS — Z803 Family history of malignant neoplasm of breast: Secondary | ICD-10-CM | POA: Insufficient documentation

## 2022-05-31 DIAGNOSIS — D72829 Elevated white blood cell count, unspecified: Secondary | ICD-10-CM

## 2022-05-31 DIAGNOSIS — Z801 Family history of malignant neoplasm of trachea, bronchus and lung: Secondary | ICD-10-CM | POA: Diagnosis not present

## 2022-05-31 DIAGNOSIS — Z8541 Personal history of malignant neoplasm of cervix uteri: Secondary | ICD-10-CM | POA: Diagnosis not present

## 2022-05-31 DIAGNOSIS — D751 Secondary polycythemia: Secondary | ICD-10-CM | POA: Diagnosis not present

## 2022-05-31 DIAGNOSIS — F17210 Nicotine dependence, cigarettes, uncomplicated: Secondary | ICD-10-CM | POA: Diagnosis not present

## 2022-05-31 DIAGNOSIS — D069 Carcinoma in situ of cervix, unspecified: Secondary | ICD-10-CM | POA: Diagnosis not present

## 2022-05-31 LAB — CBC WITH DIFFERENTIAL/PLATELET
Abs Immature Granulocytes: 0.05 10*3/uL (ref 0.00–0.07)
Basophils Absolute: 0 10*3/uL (ref 0.0–0.1)
Basophils Relative: 0 %
Eosinophils Absolute: 0 10*3/uL (ref 0.0–0.5)
Eosinophils Relative: 0 %
HCT: 46.2 % — ABNORMAL HIGH (ref 36.0–46.0)
Hemoglobin: 15.4 g/dL — ABNORMAL HIGH (ref 12.0–15.0)
Immature Granulocytes: 1 %
Lymphocytes Relative: 24 %
Lymphs Abs: 2.5 10*3/uL (ref 0.7–4.0)
MCH: 30.3 pg (ref 26.0–34.0)
MCHC: 33.3 g/dL (ref 30.0–36.0)
MCV: 90.8 fL (ref 80.0–100.0)
Monocytes Absolute: 0.5 10*3/uL (ref 0.1–1.0)
Monocytes Relative: 5 %
Neutro Abs: 7.2 10*3/uL (ref 1.7–7.7)
Neutrophils Relative %: 70 %
Platelets: 258 10*3/uL (ref 150–400)
RBC: 5.09 MIL/uL (ref 3.87–5.11)
RDW: 12.5 % (ref 11.5–15.5)
WBC: 10.3 10*3/uL (ref 4.0–10.5)
nRBC: 0 % (ref 0.0–0.2)

## 2022-05-31 LAB — CMP (CANCER CENTER ONLY)
ALT: 25 U/L (ref 0–44)
AST: 21 U/L (ref 15–41)
Albumin: 4.3 g/dL (ref 3.5–5.0)
Alkaline Phosphatase: 77 U/L (ref 38–126)
Anion gap: 7 (ref 5–15)
BUN: 7 mg/dL (ref 6–20)
CO2: 25 mmol/L (ref 22–32)
Calcium: 8.9 mg/dL (ref 8.9–10.3)
Chloride: 102 mmol/L (ref 98–111)
Creatinine: 0.77 mg/dL (ref 0.44–1.00)
GFR, Estimated: >60 mL/min (ref >60)
Glucose, Bld: 98 mg/dL (ref 70–99)
Potassium: 4.1 mmol/L (ref 3.5–5.1)
Sodium: 134 mmol/L — ABNORMAL LOW (ref 135–145)
Total Bilirubin: 0.5 mg/dL (ref 0.3–1.2)
Total Protein: 7.6 g/dL (ref 6.5–8.1)

## 2022-05-31 LAB — SEDIMENTATION RATE: Sed Rate: 5 mm/hr (ref 0–20)

## 2022-05-31 LAB — TECHNOLOGIST SMEAR REVIEW
Plt Morphology: NORMAL
RBC MORPHOLOGY: NORMAL
WBC MORPHOLOGY: NORMAL

## 2022-05-31 LAB — C-REACTIVE PROTEIN: CRP: 0.5 mg/dL (ref <1.0)

## 2022-05-31 NOTE — Progress Notes (Signed)
[facility] CONSULT NOTE  Patient Care Team: Alfredia Ferguson, Cordelia Poche as PCP - General (Physician Assistant)  CHIEF COMPLAINTS/PURPOSE OF CONSULTATION: ERYTHROCYTOSIS  HEMATOLOGY HISTORY  # ERYTHROCYTOSIS  [Hb; WBC; platelets]  # [facility] with salpingectomy [s/p cervical cancer but review of pathology, no invasive component was identified and was performed for persistent dysplastic changes. No adjuvant treatment. Not followed by oncology. ]  HISTORY OF PRESENTING ILLNESS:  Mikayla Walsh 47 y.o. female pleasant patient who was previously seen for erythrocytosis, returns to clinic for evaluation of new leukocytosis. She has chronic joint aches and pains. Was told this was osteoarthritis. She continues to smoke. She has some mild hot flashes and sweating. Not worsening. No unintentional weight loss. No recurrent infections. Had some mild skin rash on the left abdomen which has now resolved.   Review of Systems  Constitutional:  Positive for malaise/fatigue. Negative for chills, diaphoresis, fever and weight loss.  HENT:  Negative for nosebleeds and sore throat.   Eyes:  Negative for double vision.  Respiratory:  Negative for cough, hemoptysis, sputum production, shortness of breath and wheezing.   Cardiovascular:  Negative for chest pain, palpitations, orthopnea and leg swelling.  Gastrointestinal:  Negative for abdominal pain, blood in stool, constipation, diarrhea, heartburn, melena, nausea and vomiting.  Genitourinary:  Negative for dysuria, frequency, hematuria and urgency.  Musculoskeletal:  Positive for joint pain and myalgias. Negative for back pain and falls.  Skin:  Negative for itching and rash.  Neurological:  Negative for dizziness, tingling, focal weakness, weakness and headaches.  Endo/Heme/Allergies:  Does not bruise/bleed easily.  Psychiatric/Behavioral:  Negative for depression. The patient is not nervous/anxious and does not have insomnia.    MEDICAL HISTORY:  Past  Medical History:  Diagnosis Date   Allergy    Cancer (HCC)    Insomnia    Neuromuscular disorder (HCC)     SURGICAL HISTORY: Past Surgical History:  Procedure Laterality Date   ABDOMINAL HYSTERECTOMY     cervisectomy  02/2016   CESAREAN SECTION     TUBAL LIGATION      SOCIAL HISTORY: Social History   Socioeconomic History   Marital status: Divorced    Spouse name: Not on file   Number of children: Not on file   Years of education: Not on file   Highest education level: Associate degree: academic program  Occupational History   Not on file  Tobacco Use   Smoking status: Every Day    Packs/day: 1.00    Years: 20.00    Additional pack years: 0.00    Total pack years: 20.00    Types: Cigarettes   Smokeless tobacco: Never   Tobacco comments:    7 cigarettes daily-08/07/2021  Substance and Sexual Activity   Alcohol use: Yes    Alcohol/week: 1.0 standard drink of alcohol    Types: 1 Glasses of wine per week   Drug use: Never   Sexual activity: Yes  Other Topics Concern   Not on file  Social History Narrative   Lives in [place]; at home- 4 kids [9 to 22]; light smoker; no alcohol.    Social Determinants of Health   Financial Resource Strain: Medium Risk (05/15/2022)   Overall Financial Resource Strain (CARDIA)    Difficulty of Paying Living Expenses: Somewhat hard  Food Insecurity: Food Insecurity Present (05/15/2022)   Hunger Vital Sign    Worried About Running Out of Food in the Last Year: Sometimes true    Ran Out of Food in  the Last Year: Never true  Transportation Needs: No Transportation Needs (05/15/2022)   PRAPARE - Administrator, Civil Service (Medical): No    Lack of Transportation (Non-Medical): No  Physical Activity: Sufficiently Active (05/15/2022)   Exercise Vital Sign    Days of Exercise per Week: 6 days    Minutes of Exercise per Session: 40 min  Stress: No Stress Concern Present (05/15/2022)   [employer] of Occupational Health -  Occupational Stress Questionnaire    Feeling of Stress : Only a little  Social Connections: Socially Isolated (05/15/2022)   Social Connection and Isolation Panel [NHANES]    Frequency of Communication with Friends and Family: Once a week    Frequency of Social Gatherings with Friends and Family: Once a week    Attends Religious Services: More than 4 times per year    Active Member of [employer] or Organizations: No    Attends Engineer, structural: Not on file    Marital Status: Divorced  Catering manager Violence: Not on file    FAMILY HISTORY: Family History  Problem Relation Age of Onset   Hypertension Mother    Hypertension Father    Hyperlipidemia Father    Cancer Father        lung cancer   Asthma Son    Cancer Maternal Grandfather    Kidney disease Paternal Grandmother    Alzheimer's disease Paternal Grandfather    Breast cancer Paternal Aunt     ALLERGIES:  is allergic to cefdinir, corticosteroids, latex, macrobid [nitrofurantoin], morphine and related, penicillins, prednisone, simvastatin, and sulfa antibiotics.  MEDICATIONS:  Current Outpatient Medications  Medication Sig Dispense Refill   albuterol (VENTOLIN HFA) 108 (90 Base) MCG/ACT inhaler Inhale 2 puffs into the lungs every 6 (six) hours as needed for wheezing or shortness of breath. 8 g 0   ALPRAZolam (XANAX) 0.25 MG tablet Take 1-2 times daily prn severe anxiety/panic attack 20 tablet 1   escitalopram (LEXAPRO) 10 MG tablet Take 1 tablet by mouth once daily 90 tablet 1   ezetimibe (ZETIA) 10 MG tablet Take 1 tablet by mouth once daily 90 tablet 2   Phenazopyridine HCl (PYRIDIUM PO) Take by mouth as needed.     Vitamin D, Ergocalciferol, (DRISDOL) 1.25 MG (50000 UNIT) CAPS capsule Take 1 capsule (50,000 Units total) by mouth every 7 (seven) days. (taking one tablet per week) walk in lab in office 1-2 weeks after completing prescription. 12 capsule 0   No current facility-administered medications for this  visit.      PHYSICAL EXAMINATION: Vitals:   05/31/22 0954  BP: (!) 140/96  Pulse: 98  Temp: 98.1 F (36.7 C)   Filed Weights   05/31/22 0954  Weight: 169 lb (76.7 kg)    Physical Exam Vitals reviewed.  Constitutional:      Appearance: She is not ill-appearing.  Cardiovascular:     Rate and Rhythm: Normal rate and regular rhythm.  Abdominal:     General: There is no distension.     Palpations: Abdomen is soft.     Tenderness: There is no abdominal tenderness. There is no guarding.  Musculoskeletal:        General: No deformity.     Right lower leg: No edema.     Left lower leg: No edema.  Lymphadenopathy:     Cervical: No cervical adenopathy.  Skin:    General: Skin is warm and dry.  Neurological:     Mental Status: She is  alert and oriented to person, place, and time. Mental status is at baseline.  Psychiatric:        Mood and Affect: Mood normal.        Behavior: Behavior normal.      LABORATORY DATA:  I have reviewed the data as listed Lab Results  Component Value Date   WBC 11.2 (H) 05/20/2022   HGB 15.0 05/20/2022   HCT 44.1 05/20/2022   MCV 90 05/20/2022   PLT 273 05/20/2022   Recent Labs    01/05/22 1637 05/31/22 1052  NA 139 134*  K 4.1 4.1  CL 101 102  CO2 24 25  GLUCOSE 94 98  BUN 8 7  CREATININE 0.76 0.77  CALCIUM 9.1 8.9  GFRNONAA  --  >60  PROT 7.1 7.6  ALBUMIN 4.6 4.3  AST 18 21  ALT 19 25  ALKPHOS 104 77  BILITOT 0.3 0.5     No results found.  ASSESSMENT & PLAN:   No problem-specific Assessment & Plan notes found for this encounter.  Leukocytosis- I reviewed the potential causes of leukocytosis (specifically mild neutrophilia) including but not limited to: a) Any active inflammatory condition or infection b) Cigarette smoking, which may be the most common cause of mild neutrophilia c) Previously diagnosed hematologic disease (such as acute and chronic leukemias, chronic myeloproliferative or myelodysplastic disease) d) The  presence of, and treatment for, a chronic anxiety state, panic disorder, rage, or emotional stress (eg, posttraumatic stress disorder, depression) e) Presence of non-hematologic diseases known to increase neutrophil counts (eg, eclampsia, thyroid storm, hypercortisolism) f) Prior splenectomy or known asplenia g) Positive family history of neutrophilia h) Recent vaccination I) Medications -- Various medications may cause neutrophilia. However,  such cases are rare and appear in the literature as isolated case reports. She is clinically asymptomatic and I am suspicious of benign/reactive etiology. Plan today is to proceed with a CBC with peripheral smear review, evaluation for MPD, BCR-ABL to r/o CML, CRP and ESR to look for occult inflammatory disease Erythrocytosis: ref 05/2020- Hemoglobin 15.9 hematocrit 46.8. JAK2 negative. Thought to be d/t secondary etiology.  Chronic arthralgias & myalgias- s/p workup with [facility] and reportedly osteoarthritis.  History of carcinoma in situ of cervix- patient previously reported history of cervical cancer but pathology I was able to review from her [facility] BS in January 2018 was consistent with CIS as opposed to invasive malignancy. Ovaries in situ. She agrees that she did not have invasive cancer. She is no longer followed by gyn onc but previously received care with [facility] in [place], [place]. 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 She would still need vaginal paps. Reviewed that if she is smoker she is unlikely to clear hpv d/t low oxygenation of cells of vagina which increases risk of second malignancies.  Smoker- encouraged smoking cessation. Consider medication management, nicotine replacement, and psychotherapy. Consider ref to [facility] Smoking Cessation Program or QuitSmart program.    Disposition:  Follow up in 3 weeks virtually for discussion of results - la  All questions were answered. The patient knows to call the clinic with any problems, questions or  concerns.  Thank you Alfredia Ferguson, PA for allowing me to participate in the care of your pleasant patient. Please do not hesitate to contact me with questions or concerns in the interim.  Alinda Dooms, NP 05/31/2022   CC: Alfredia Ferguson, PA

## 2022-06-04 LAB — BCR-ABL1 FISH
Cells Analyzed: 200
Cells Counted: 200

## 2022-06-05 LAB — BCR-ABL1, CML/ALL, PCR, QUANT: Interpretation (BCRAL):: NEGATIVE

## 2022-06-15 ENCOUNTER — Telehealth: Payer: Medicaid Other | Admitting: Family Medicine

## 2022-06-15 DIAGNOSIS — R3989 Other symptoms and signs involving the genitourinary system: Secondary | ICD-10-CM

## 2022-06-15 MED ORDER — CEPHALEXIN 500 MG PO CAPS
500.0000 mg | ORAL_CAPSULE | Freq: Two times a day (BID) | ORAL | 0 refills | Status: AC
Start: 2022-06-15 — End: 2022-06-22

## 2022-06-15 NOTE — Progress Notes (Signed)

## 2022-06-21 ENCOUNTER — Inpatient Hospital Stay: Payer: Medicaid Other | Attending: Nurse Practitioner | Admitting: Nurse Practitioner

## 2022-06-21 ENCOUNTER — Encounter: Payer: Self-pay | Admitting: Nurse Practitioner

## 2022-06-21 DIAGNOSIS — D751 Secondary polycythemia: Secondary | ICD-10-CM

## 2022-06-21 DIAGNOSIS — D72829 Elevated white blood cell count, unspecified: Secondary | ICD-10-CM | POA: Diagnosis not present

## 2022-06-21 DIAGNOSIS — D069 Carcinoma in situ of cervix, unspecified: Secondary | ICD-10-CM

## 2022-06-21 NOTE — Progress Notes (Signed)
[facility] CONSULT NOTE  Virtual Visit Progress Note  I connected with Deiondra Wohler on 06/21/22 at  3:30 PM EDT by video enabled telemedicine visit and verified that I am speaking with the correct person using two identifiers.   I discussed the limitations, risks, security and privacy concerns of performing an evaluation and management service by telemedicine and the availability of in-person appointments. I also discussed with the patient that there may be a patient responsible charge related to this service. The patient expressed understanding and agreed to proceed.   Other persons participating in the visit and their role in the encounter: none   Patient's location: home  Provider's location: clinic   Patient Care Team: Burnett Corrente as PCP - General (Physician Assistant)  CHIEF COMPLAINTS/PURPOSE OF CONSULTATION: ERYTHROCYTOSIS  HEMATOLOGY HISTORY  # ERYTHROCYTOSIS  [Hb; WBC; platelets]  # [facility] with salpingectomy [s/p cervical cancer but review of pathology, no invasive component was identified and was performed for persistent dysplastic changes. No adjuvant treatment. Not followed by oncology. ]  HISTORY OF PRESENTING ILLNESS:  Mikayla Walsh 47 y.o. female pleasant patient who was previously seen for erythrocytosis, re-referred for leukocytosis, who agrees to evaluation via telemedicine for discussion of workup. She continues to complain of chronic joint aches and pains. Continues to smoke. She complains of weight gain. Currently being treated for UTI.     Review of Systems  Constitutional:  Positive for malaise/fatigue. Negative for chills, diaphoresis, fever and weight loss.  HENT:  Negative for nosebleeds and sore throat.   Eyes:  Negative for double vision.  Respiratory:  Negative for cough, hemoptysis, sputum production, shortness of breath and wheezing.   Cardiovascular:  Negative for chest pain, palpitations, orthopnea and leg swelling.   Gastrointestinal:  Negative for abdominal pain, blood in stool, constipation, diarrhea, heartburn, melena, nausea and vomiting.  Genitourinary:  Negative for dysuria, frequency, hematuria and urgency.  Musculoskeletal:  Positive for joint pain and myalgias. Negative for back pain and falls.  Skin:  Negative for itching and rash.  Neurological:  Negative for dizziness, tingling, focal weakness, weakness and headaches.  Endo/Heme/Allergies:  Does not bruise/bleed easily.  Psychiatric/Behavioral:  Negative for depression. The patient is not nervous/anxious and does not have insomnia.    MEDICAL HISTORY:  Past Medical History:  Diagnosis Date   Allergy    Cancer (HCC)    Insomnia    Neuromuscular disorder (HCC)     SURGICAL HISTORY: Past Surgical History:  Procedure Laterality Date   ABDOMINAL HYSTERECTOMY     cervisectomy  02/2016   CESAREAN SECTION     TUBAL LIGATION      SOCIAL HISTORY: Social History   Socioeconomic History   Marital status: Divorced    Spouse name: Not on file   Number of children: Not on file   Years of education: Not on file   Highest education level: Associate degree: academic program  Occupational History   Not on file  Tobacco Use   Smoking status: Every Day    Packs/day: 1.00    Years: 20.00    Additional pack years: 0.00    Total pack years: 20.00    Types: Cigarettes   Smokeless tobacco: Never   Tobacco comments:    7 cigarettes daily-08/07/2021  Substance and Sexual Activity   Alcohol use: Yes    Alcohol/week: 1.0 standard drink of alcohol    Types: 1 Glasses of wine per week   Drug use: Never   Sexual activity:  Yes  Other Topics Concern   Not on file  Social History Narrative   Lives in [place]; at home- 4 kids [9 to 22]; light smoker; no alcohol.    Social Determinants of Health   Financial Resource Strain: Medium Risk (05/15/2022)   Overall Financial Resource Strain (CARDIA)    Difficulty of Paying Living Expenses: Somewhat  hard  Food Insecurity: Food Insecurity Present (05/15/2022)   Hunger Vital Sign    Worried About Running Out of Food in the Last Year: Sometimes true    Ran Out of Food in the Last Year: Never true  Transportation Needs: No Transportation Needs (05/15/2022)   PRAPARE - Administrator, Civil Service (Medical): No    Lack of Transportation (Non-Medical): No  Physical Activity: Sufficiently Active (05/15/2022)   Exercise Vital Sign    Days of Exercise per Week: 6 days    Minutes of Exercise per Session: 40 min  Stress: No Stress Concern Present (05/15/2022)   [employer] of Occupational Health - Occupational Stress Questionnaire    Feeling of Stress : Only a little  Social Connections: Socially Isolated (05/15/2022)   Social Connection and Isolation Panel [NHANES]    Frequency of Communication with Friends and Family: Once a week    Frequency of Social Gatherings with Friends and Family: Once a week    Attends Religious Services: More than 4 times per year    Active Member of [employer] or Organizations: No    Attends Engineer, structural: Not on file    Marital Status: Divorced  Catering manager Violence: Not on file    FAMILY HISTORY: Family History  Problem Relation Age of Onset   Hypertension Mother    Hypertension Father    Hyperlipidemia Father    Cancer Father        lung cancer   Asthma Son    Cancer Maternal Grandfather    Kidney disease Paternal Grandmother    Alzheimer's disease Paternal Grandfather    Breast cancer Paternal Aunt     ALLERGIES:  is allergic to cefdinir, corticosteroids, latex, macrobid [nitrofurantoin], morphine and codeine, penicillins, prednisone, simvastatin, and sulfa antibiotics.  MEDICATIONS:  Current Outpatient Medications  Medication Sig Dispense Refill   albuterol (VENTOLIN HFA) 108 (90 Base) MCG/ACT inhaler Inhale 2 puffs into the lungs every 6 (six) hours as needed for wheezing or shortness of breath. 8 g 0    ALPRAZolam (XANAX) 0.25 MG tablet Take 1-2 times daily prn severe anxiety/panic attack 20 tablet 1   cephALEXin (KEFLEX) 500 MG capsule Take 1 capsule (500 mg total) by mouth 2 (two) times daily for 7 days. 14 capsule 0   escitalopram (LEXAPRO) 10 MG tablet Take 1 tablet by mouth once daily 90 tablet 1   ezetimibe (ZETIA) 10 MG tablet Take 1 tablet by mouth once daily 90 tablet 2   Phenazopyridine HCl (PYRIDIUM PO) Take by mouth as needed.     Vitamin D, Ergocalciferol, (DRISDOL) 1.25 MG (50000 UNIT) CAPS capsule Take 1 capsule (50,000 Units total) by mouth every 7 (seven) days. (taking one tablet per week) walk in lab in office 1-2 weeks after completing prescription. 12 capsule 0   No current facility-administered medications for this visit.      PHYSICAL EXAMINATION: There were no vitals filed for this visit.  There were no vitals filed for this visit.  Physical Exam Constitutional:      Appearance: She is not ill-appearing.  Skin:    General:  Skin is warm.  Neurological:     Mental Status: She is alert and oriented to person, place, and time.  Psychiatric:        Mood and Affect: Mood normal.        Behavior: Behavior normal.      LABORATORY DATA:  I have reviewed the data as listed Lab Results  Component Value Date   WBC 10.3 05/31/2022   HGB 15.4 (H) 05/31/2022   HCT 46.2 (H) 05/31/2022   MCV 90.8 05/31/2022   PLT 258 05/31/2022   Recent Labs    01/05/22 1637 05/31/22 1052  NA 139 134*  K 4.1 4.1  CL 101 102  CO2 24 25  GLUCOSE 94 98  BUN 8 7  CREATININE 0.76 0.77  CALCIUM 9.1 8.9  GFRNONAA  --  >60  PROT 7.1 7.6  ALBUMIN 4.6 4.3  AST 18 21  ALT 19 25  ALKPHOS 104 77  BILITOT 0.3 0.5      No results found.  ASSESSMENT & PLAN:   No problem-specific Assessment & Plan notes found for this encounter.  Leukocytosis-  Previously reviewed potential causes of leukocytosis. Today we reviewed workup including BCR-ABL, CRP, ESR, smear which was negative  or unremarkable. Therefore, I suspect a reactive etiology particularly stress vs cigarette smoking vs chronic inflammation. No apparent uptrend in her numbers currently and she is clinically asymptomatic. She can follow up with her PCP for monitoring of her counts 2-4 times a year. If concerning, she can return to cancer center for re-evaluation.  Erythrocytosis: Ref 05/2020- Hemoglobin 15.9 hematocrit 46.8. JAK2 negative. Thought to be d/t secondary etiology, likely smoking. Could consider therapeutic phlebotomy or blood donation if symptomatic or is hematocrit > 50. Could consider low dose aspirin for thrombosis prophylaxis  though evidence is limited.  Chronic arthralgias & myalgias- s/p workup with [facility] and reportedly osteoarthritis.  History of carcinoma in situ of cervix- patient previously reported history of cervical cancer but I reviewed pathology from her [facility]-BS January 2018 which was consistent with Carcinoma in situ as opposed to invasive malignancy. Ovaries retained. She agrees that she did not have invasive cancer. She is no longer followed by gyn onc but previously received care with [facility] in [place], [place]. 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 She would still need vaginal paps for monitoring of dysplasia and I reviewed that she is at risk of development of vaginal and/or vulvar dysplasia and malignancy. Reviewed that if she is smoker she is unlikely to clear hpv d/t low oxygenation of cells of vagina which increases risk of second malignancies. She has an appointment with Dr. Logan Bores at [facility] next week for management.  Smoker- encouraged smoking cessation. Consider medication management, nicotine replacement, and psychotherapy. Consider ref to [facility] Smoking Cessation Program or QuitSmart program.    Disposition:  Follow up with PCP. She can be re-referred to [facility] as needed.   I discussed the assessment and treatment plan with the patient. The patient was provided an  opportunity to ask questions and all were answered. The patient agreed with the plan and demonstrated an understanding of the instructions.   The patient was advised to call back or seek an in-person evaluation if the symptoms worsen or if the condition fails to improve as anticipated.   I spent 30 minutes face-to-face video visit time dedicated to the care of this patient on the date of this encounter to include pre-visit review of labs, prior note, discussion with PCP, face-to-face time  with the patient, and post visit ordering of testing/documentation.   Alinda Dooms, NP 06/21/2022   CC: Alfredia Ferguson, PA, Dr. Logan Bores

## 2022-06-30 ENCOUNTER — Encounter: Payer: Self-pay | Admitting: Obstetrics and Gynecology

## 2022-06-30 ENCOUNTER — Other Ambulatory Visit (HOSPITAL_COMMUNITY)
Admission: RE | Admit: 2022-06-30 | Discharge: 2022-06-30 | Disposition: A | Discharge status: Home / Self Care | Payer: Medicaid Other | Source: Ambulatory Visit | Attending: Obstetrics and Gynecology | Admitting: Obstetrics and Gynecology

## 2022-06-30 ENCOUNTER — Ambulatory Visit (INDEPENDENT_AMBULATORY_CARE_PROVIDER_SITE_OTHER): Payer: Medicaid Other | Admitting: Obstetrics and Gynecology

## 2022-06-30 VITALS — BP 107/75 | HR 88 | Ht 68.0 in | Wt 161.4 lb

## 2022-06-30 DIAGNOSIS — Z01419 Encounter for gynecological examination (general) (routine) without abnormal findings: Secondary | ICD-10-CM | POA: Diagnosis not present

## 2022-06-30 DIAGNOSIS — Z7689 Persons encountering health services in other specified circumstances: Secondary | ICD-10-CM

## 2022-06-30 DIAGNOSIS — Z113 Encounter for screening for infections with a predominantly sexual mode of transmission: Secondary | ICD-10-CM

## 2022-06-30 DIAGNOSIS — Z1272 Encounter for screening for malignant neoplasm of vagina: Secondary | ICD-10-CM

## 2022-06-30 DIAGNOSIS — N898 Other specified noninflammatory disorders of vagina: Secondary | ICD-10-CM

## 2022-06-30 DIAGNOSIS — B851 Pediculosis due to Pediculus humanus corporis: Secondary | ICD-10-CM

## 2022-06-30 DIAGNOSIS — Z124 Encounter for screening for malignant neoplasm of cervix: Secondary | ICD-10-CM

## 2022-06-30 NOTE — Progress Notes (Signed)
Patient presents today for a pap smear. She reports having a hysterectomy in 2018 due to precancerous cells. No additional concerns.

## 2022-06-30 NOTE — Progress Notes (Signed)
HPI:      [name] is a 47 y.o. [id number] who LMP was No LMP recorded. Patient has had a hysterectomy.  Subjective:   She presents today for her annual examination.  She states that she recently had a bladder infection was treated with Keflex.  She subsequently developed a yeast infection and has used over-the-counter antifungal medication.  She is not sure if it is gone.  She does think her UTI has resolved. She has no immediate concerns but likes to be tested for STDs and is requesting vaginal testing only today.  Does not want to do blood work.  She would also like to be tested for yeast and BV. Of significant note patient has had a previous hysterectomy for "precancer cells" on her cervix.  She has not had a follow-up Pap of the vaginal cuff in more than 3 years. She is occasionally sexually active and occasionally has pelvic pain with intercourse but this does not seem to be a major issue for her.    Hx: The following portions of the patient's history were reviewed and updated as appropriate:             She  has a past medical history of Allergy, Cancer (HCC), Insomnia, and Neuromuscular disorder (HCC). She does not have any pertinent problems on file. She  has a past surgical history that includes Cesarean section; Abdominal hysterectomy; Tubal ligation; and cervisectomy (02/2016). Her family history includes Alzheimer's disease in her paternal grandfather; Asthma in her son; Breast cancer in her paternal aunt; Cancer in her father and maternal grandfather; Hyperlipidemia in her father; Hypertension in her father and mother; Kidney disease in her paternal grandmother. She  reports that she has been smoking cigarettes. She has a 20.00 pack-year smoking history. She has never used smokeless tobacco. She reports current alcohol use of about 1.0 standard drink of alcohol per week. She reports that she does not use drugs. She has a current medication list which includes the following  prescription(s): albuterol, alprazolam, escitalopram, ezetimibe, and phenazopyridine hcl. She is allergic to beef (bovine) protein, cefdinir, corticosteroids, lactose, latex, macrobid [nitrofurantoin], morphine and codeine, penicillins, prednisone, simvastatin, and sulfa antibiotics.       Review of Systems:  Review of Systems  Constitutional: Denied constitutional symptoms, night sweats, recent illness, fatigue, fever, insomnia and weight loss.  Eyes: Denied eye symptoms, eye pain, photophobia, vision change and visual disturbance.  Ears/Nose/Throat/Neck: Denied ear, nose, throat or neck symptoms, hearing loss, nasal discharge, sinus congestion and sore throat.  Cardiovascular: Denied cardiovascular symptoms, arrhythmia, chest pain/pressure, edema, exercise intolerance, orthopnea and palpitations.  Respiratory: Denied pulmonary symptoms, asthma, pleuritic pain, productive sputum, cough, dyspnea and wheezing.  Gastrointestinal: Denied, gastro-esophageal reflux, melena, nausea and vomiting.  Genitourinary: See HPI for additional information.  Musculoskeletal: Denied musculoskeletal symptoms, stiffness, swelling, muscle weakness and myalgia.  Dermatologic: Denied dermatology symptoms, rash and scar.  Neurologic: Denied neurology symptoms, dizziness, headache, neck pain and syncope.  Psychiatric: Denied psychiatric symptoms, anxiety and depression.  Endocrine: Denied endocrine symptoms including hot flashes and night sweats.   Meds:   Current Outpatient Medications on File Prior to Visit  Medication Sig Dispense Refill   albuterol (VENTOLIN HFA) 108 (90 Base) MCG/ACT inhaler Inhale 2 puffs into the lungs every 6 (six) hours as needed for wheezing or shortness of breath. 8 g 0   ALPRAZolam (XANAX) 0.25 MG tablet Take 1-2 times daily prn severe anxiety/panic attack 20 tablet 1   escitalopram (LEXAPRO) 10 MG  tablet Take 1 tablet by mouth once daily 90 tablet 1   ezetimibe (ZETIA) 10 MG tablet  Take 1 tablet by mouth once daily 90 tablet 2   Phenazopyridine HCl (PYRIDIUM PO) Take by mouth as needed.     No current facility-administered medications on file prior to visit.     Objective:     Vitals:   06/30/22 1030  BP: 107/75  Pulse: 88    Filed Weights   06/30/22 1030  Weight: 161 lb 6.4 oz (73.2 kg)              Physical examination General NAD, Conversant  HEENT Atraumatic; Op clear with mmm.  Normo-cephalic. Pupils reactive. Anicteric sclerae  Thyroid/Neck Smooth without nodularity or enlargement. Normal ROM.  Neck Supple.  Skin No rashes, lesions or ulceration. Normal palpated skin turgor. No nodularity.  Breasts: No masses or discharge.  Symmetric.  No axillary adenopathy.  Lungs: Clear to auscultation.No rales or wheezes. Normal Respiratory effort, no retractions.  Heart: NSR.  No murmurs or rubs appreciated. No peripheral edema  Abdomen: Soft.  Non-tender.  No masses.  No HSM. No hernia  Extremities: Moves all appropriately.  Normal ROM for age. No lymphadenopathy.  Neuro: Oriented to PPT.  Normal mood. Normal affect.     Pelvic:   Vulva: Normal appearance.  No lesions.   Vagina: No lesions or abnormalities noted.  Support: Normal pelvic support.  Urethra No masses tenderness or scarring.  Meatus Normal size without lesions or prolapse.  Cervix: Surgically absent   Anus: Normal exam.  No lesions.  Perineum: Normal exam.  No lesions.        Bimanual   Uterus: Surgically absent   Adnexae: No masses.  Non-tender to palpation.  Cul-de-sac: Negative for abnormality.     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   RUQ abdominal pain 01/05/2022   Loud snoring 08/07/2021   Acute stress reaction 06/25/2021   Weight gain 04/22/2021   Chest pain 04/22/2021   Primary osteoarthritis of both hands 01/27/2021   Mixed hyperlipidemia 12/10/2020   Insomnia 12/10/2020    Generalized abdominal pain 12/10/2020   Arthralgia of both hands 12/10/2020   Erythrocytosis 05/09/2020   Axillary lymphadenopathy 04/30/2020   Elevated hemoglobin (HCC) 04/30/2020   Abnormal CT lung screening 04/30/2020   Pulmonary nodule 04/30/2020   Urinary frequency 01/11/2020   Neuromuscular disease (HCC) 01/11/2020   Vitamin D insufficiency 01/11/2020   Melanocytic nevus 11/16/2016   Restless legs syndrome 11/16/2016   Smoker 11/16/2016     1. Establishing care with new doctor, encounter for   2. Screening for vaginal cancer     Possible monilia infection based on history and appearance at speculum exam.  History of CIN and previous hysterectomy.   Plan:            1.  Basic Screening Recommendations The basic screening recommendations for asymptomatic women were discussed with the patient during her visit.  The age-appropriate recommendations were discussed with her and the rational for the tests reviewed.  When I am informed by the patient that another primary care physician has previously obtained the age-appropriate tests and they are up-to-date, only outstanding tests are ordered and referrals given as necessary.  Abnormal results of tests will be discussed with her when all of her results are completed.  Routine preventative health maintenance measures emphasized: Exercise/Diet/Weight control, Tobacco Warnings, Alcohol/Substance  use risks and Stress Management Vaginal cuff Pap performed-if normal consider 5-year testing. 2.  Mammogram as scheduled 3.  Nuswab performed including STD testing as requested   Orders No orders of the defined types were placed in this encounter.   No orders of the defined types were placed in this encounter.         F/U  No follow-ups on file.  Elonda Husky, M.D. 06/30/2022 10:41 AM

## 2022-07-01 LAB — CERVICOVAGINAL ANCILLARY ONLY
Bacterial Vaginitis (gardnerella): NEGATIVE
Candida Glabrata: NEGATIVE
Candida Vaginitis: NEGATIVE
Chlamydia: NEGATIVE
Comment: NEGATIVE
Comment: NEGATIVE
Comment: NEGATIVE
Comment: NEGATIVE
Comment: NEGATIVE
Comment: NORMAL
Neisseria Gonorrhea: NEGATIVE
Trichomonas: NEGATIVE

## 2022-07-03 ENCOUNTER — Telehealth: Payer: Medicaid Other | Admitting: Physician Assistant

## 2022-07-03 DIAGNOSIS — R399 Unspecified symptoms and signs involving the genitourinary system: Secondary | ICD-10-CM

## 2022-07-03 NOTE — Progress Notes (Signed)
Because your were just treated for a UTI through an Evisit without resolution of symptoms I recommend follow up with PCP or Urgent Care for urinalysis and urine culture, I feel your condition warrants further evaluation and I recommend that you be seen in a face to face visit.   NOTE: There will be NO CHARGE for this eVisit   If you are having a true medical emergency please call 911.      For an urgent face to face visit, [place] has eight urgent care centers for your convenience:   NEW!! [facility] Health Urgent Care Center at [facility] Get Driving Directions [phone] [address], [address] [place], [address]    [facility] Health Urgent Care Center at [facility] Get Driving Directions [phone] [address] [place], [place] [address]   [facility] Health Urgent Care Center [facility]) Get Driving Directions [phone] [address] [place], [place] [address]  [facility] Health Urgent Care Center [facility]) Get Driving Directions [phone] [address] [place],  [place]  [address]  [facility] Health Urgent Care Center [facility] at [employer]  [phone] [address] [place],  [place] [address]   [facility] Health Urgent Care at [facility] Get Driving Directions [phone] [address] [place], [place] [address]   [facility] Health Urgent Care at [facility] Get Driving Directions  [phone] [address] [place], [place] [address]   [facility] Health Urgent Care at [facility] Directions [phone] [address] [place], [place] [address]  Your MyChart E-visit questionnaire answers were reviewed by a board certified advanced clinical practitioner to complete your personal care plan based on your specific symptoms.  Thank you for using e-Visits.    I have spent 5 minutes in review of e-visit questionnaire, review and  updating patient chart, medical decision making and response to patient.   Tylene Fantasia Ward, PA-C

## 2022-07-05 DIAGNOSIS — B379 Candidiasis, unspecified: Secondary | ICD-10-CM | POA: Diagnosis not present

## 2022-07-05 DIAGNOSIS — R3915 Urgency of urination: Secondary | ICD-10-CM | POA: Diagnosis not present

## 2022-07-05 DIAGNOSIS — N3001 Acute cystitis with hematuria: Secondary | ICD-10-CM | POA: Diagnosis not present

## 2022-07-05 DIAGNOSIS — T3695XA Adverse effect of unspecified systemic antibiotic, initial encounter: Secondary | ICD-10-CM | POA: Diagnosis not present

## 2022-07-06 LAB — CYTOLOGY - PAP
Comment: NEGATIVE
Diagnosis: NEGATIVE
High risk HPV: NEGATIVE

## 2022-07-16 ENCOUNTER — Encounter: Payer: Self-pay | Admitting: Physician Assistant

## 2022-07-16 ENCOUNTER — Ambulatory Visit
Admission: RE | Admit: 2022-07-16 | Discharge: 2022-07-16 | Disposition: A | Discharge status: Home / Self Care | Payer: Medicaid Other | Source: Ambulatory Visit | Attending: Physician Assistant | Admitting: Physician Assistant

## 2022-07-16 DIAGNOSIS — Z1231 Encounter for screening mammogram for malignant neoplasm of breast: Secondary | ICD-10-CM

## 2022-07-19 ENCOUNTER — Other Ambulatory Visit: Payer: Self-pay | Admitting: Physician Assistant

## 2022-07-19 ENCOUNTER — Ambulatory Visit: Payer: Medicaid Other | Admitting: Physician Assistant

## 2022-07-19 DIAGNOSIS — F430 Acute stress reaction: Secondary | ICD-10-CM

## 2022-07-21 ENCOUNTER — Telehealth: Payer: Self-pay

## 2022-07-21 NOTE — Telephone Encounter (Signed)
Pt left message to cancel and reschedule colonoscopy please return call 

## 2022-07-22 ENCOUNTER — Encounter: Payer: Self-pay | Admitting: Gastroenterology

## 2022-07-22 NOTE — Telephone Encounter (Signed)
Patients colonoscopy has been rescheduled form 07/23/22 to 09/10/22.  Vikki in Endo has been made aware of date change.  Instructions updated.  Referral updated.  Thanks, Dixie Jafri, CMA 

## 2022-07-22 NOTE — Telephone Encounter (Signed)
Patients colonoscopy has been rescheduled form 07/23/22 to 09/10/22.  Vikki in Endo has been made aware of date change.  Instructions updated.  Referral updated.  Thanks, [place], [place]

## 2022-09-08 ENCOUNTER — Telehealth: Payer: Self-pay

## 2022-09-08 NOTE — Telephone Encounter (Signed)
Message left for patient to return my call.  

## 2022-09-08 NOTE — Telephone Encounter (Signed)
Pt left message to cancel procedure for 09/10/2022 request a return call

## 2022-09-09 ENCOUNTER — Encounter: Payer: Self-pay | Admitting: Gastroenterology

## 2022-09-09 NOTE — Telephone Encounter (Signed)
Spoken to patient this morning. She stated that she need cancel due to work. I did offer to reschedule and patient stated that she will call back.  Called endo unit to make the change.

## 2022-09-10 ENCOUNTER — Encounter: Admission: RE | Payer: Self-pay | Source: Home / Self Care

## 2022-09-10 ENCOUNTER — Ambulatory Visit: Admission: RE | Admit: 2022-09-10 | Payer: Medicaid Other | Source: Home / Self Care | Admitting: Gastroenterology

## 2022-09-10 SURGERY — COLONOSCOPY WITH PROPOFOL
Anesthesia: General

## 2022-09-20 ENCOUNTER — Telehealth: Payer: Medicaid Other | Admitting: Family Medicine

## 2022-09-20 DIAGNOSIS — R3989 Other symptoms and signs involving the genitourinary system: Secondary | ICD-10-CM

## 2022-09-20 NOTE — Progress Notes (Signed)
Mikayla Walsh,   Looks like you had trouble with clearing the infection last time we treated you virtually. We are recommending you be seen in person to have a urine sample collected and culture to make sure we get you on the best medications.     NOTE: There will be NO CHARGE for this eVisit   If you are having a true medical emergency please call 911.      For an urgent face to face visit, [place] has eight urgent care centers for your convenience:    NEW!! [facility] Health Urgent Care Center at [facility] Get Driving Directions [phone] [address] [place], [address]     [facility] Health Urgent Care Center at [facility] Get Driving Directions [phone] [address] [place], [place] [address]   [facility] Health Urgent Care Center [facility]) Get Driving Directions [phone] [address] [place], [place] [address]   [facility] Health Urgent Care Center [facility]) Get Driving Directions [phone] [address] [place],  [place]  [address]   [facility] Health Urgent Care Center [facility] at [employer]  [phone] [address] [place],  [place] [address]     [facility] Health Urgent Care at [facility] Get Driving Directions [phone] [place], [place] [address]   [facility] Health Urgent Care at [facility] Get Driving Directions  [phone] [address] [place], [place] [address]   [facility] Health Urgent Care at [facility] Directions [phone] [address], [address] [place], [place] [address]   Your MyChart E-visit questionnaire answers were reviewed by a board certified advanced clinical practitioner to complete your personal care plan based on your specific symptoms.  Thank you for using e-Visits.

## 2022-09-21 DIAGNOSIS — R3915 Urgency of urination: Secondary | ICD-10-CM | POA: Diagnosis not present

## 2022-09-21 DIAGNOSIS — R350 Frequency of micturition: Secondary | ICD-10-CM | POA: Diagnosis not present

## 2022-10-23 DIAGNOSIS — S59901A Unspecified injury of right elbow, initial encounter: Secondary | ICD-10-CM | POA: Diagnosis not present

## 2022-10-23 DIAGNOSIS — W228XXA Striking against or struck by other objects, initial encounter: Secondary | ICD-10-CM | POA: Diagnosis not present

## 2022-12-14 DIAGNOSIS — Z299 Encounter for prophylactic measures, unspecified: Secondary | ICD-10-CM | POA: Diagnosis not present

## 2022-12-14 DIAGNOSIS — N3001 Acute cystitis with hematuria: Secondary | ICD-10-CM | POA: Diagnosis not present

## 2023-03-14 ENCOUNTER — Telehealth: Payer: Medicaid Other | Admitting: Nurse Practitioner

## 2023-03-14 DIAGNOSIS — R3989 Other symptoms and signs involving the genitourinary system: Secondary | ICD-10-CM

## 2023-03-14 DIAGNOSIS — R350 Frequency of micturition: Secondary | ICD-10-CM | POA: Diagnosis not present

## 2023-03-14 DIAGNOSIS — N3000 Acute cystitis without hematuria: Secondary | ICD-10-CM | POA: Diagnosis not present

## 2023-03-14 DIAGNOSIS — R300 Dysuria: Secondary | ICD-10-CM | POA: Diagnosis not present

## 2023-03-14 NOTE — Progress Notes (Signed)
 Because of the high amount of antibiotic allergies you have and your history of normal cultures, I feel your condition warrants further evaluation and I recommend that you be seen for a face to face visit.  Please contact your primary care physician practice to be seen. Many offices offer virtual options to be seen via video if you are not comfortable going in person to a medical facility at this time.   It will be best to run another urine culture to assure you have an infection and need an antibiotic, and to check for antibiotic sensitivity  NOTE: You will NOT be charged for this eVisit.  If you do not have a PCP, [place] offers a free physician referral service available at [phone]. Our trained staff has the experience, knowledge and resources to put you in touch with a physician who is right for you.    If you are having a true medical emergency please call 911.   Your e-visit answers were reviewed by a board certified advanced clinical practitioner to complete your personal care plan.  Thank you for using e-Visits.

## 2023-05-23 DIAGNOSIS — R079 Chest pain, unspecified: Secondary | ICD-10-CM | POA: Diagnosis not present

## 2023-05-23 DIAGNOSIS — R911 Solitary pulmonary nodule: Secondary | ICD-10-CM | POA: Diagnosis not present

## 2023-05-23 DIAGNOSIS — R000 Tachycardia, unspecified: Secondary | ICD-10-CM | POA: Diagnosis not present

## 2023-05-23 DIAGNOSIS — R0600 Dyspnea, unspecified: Secondary | ICD-10-CM | POA: Diagnosis not present

## 2023-05-24 ENCOUNTER — Encounter: Payer: Self-pay | Admitting: Nurse Practitioner

## 2023-05-24 NOTE — Telephone Encounter (Signed)
 Seen by Dr. Debbi Failing for leukocytosis with last by video visit with Adolfo Hooker, NP on 06/2022 with f/u prn.

## 2023-05-30 ENCOUNTER — Ambulatory Visit: Admitting: Family Medicine

## 2023-05-30 ENCOUNTER — Encounter: Payer: Self-pay | Admitting: Family Medicine

## 2023-05-30 ENCOUNTER — Ambulatory Visit: Attending: Family Medicine

## 2023-05-30 VITALS — BP 140/91 | HR 86 | Ht 69.0 in | Wt 163.9 lb

## 2023-05-30 DIAGNOSIS — R911 Solitary pulmonary nodule: Secondary | ICD-10-CM | POA: Diagnosis not present

## 2023-05-30 DIAGNOSIS — F17200 Nicotine dependence, unspecified, uncomplicated: Secondary | ICD-10-CM

## 2023-05-30 DIAGNOSIS — R002 Palpitations: Secondary | ICD-10-CM

## 2023-05-30 NOTE — Progress Notes (Signed)
 Acute visit - hosp f/u   Patient: Mikayla Walsh   DOB: 08-22-1975   48 y.o. Female  MRN: [id number] PCP: Mazie Speed, MD (was Heidi Llamas)  Chief Complaint  Patient presents with   Follow-up    Patient was seen at [facility] ER in [facility] due to heart right spiking to 150 and was having a hard time getting back down. Patient had labs, EKG , and Ct Scan completed. Was advised to have rechecked in 6 months. Pt reports she would like taken care of before then due to family hx.    Subjective    Discussed the use of AI scribe software for clinical note transcription with the patient, who gave verbal consent to proceed.  History of Present Illness   The patient, with a history of smoking and a strong family history of lung cancer, presents for follow-up after a recent episode of tachycardia and chest pain. The episode occurred while the patient was in [place] , and she sought care in the ER. The patient describes the heart rate as "spiking to like one fifty" and not going back down, accompanied by chest pain. The patient was concerned as the symptoms did not resolve with anxiety medication. The ER physician suggested possible atrial fibrillation, but no definitive diagnosis was made as the episode self-resolved.  In addition to the cardiac symptoms, a CT scan performed in the ER revealed a small 5mm nodule in the right lower lobe of the lung. The patient expresses concern about this finding due to a strong family history of lung cancer, including the loss of her father, aunt, two great uncles, and a great grandparent to the disease. Not all of these family members were smokers.  The patient also mentions a history of high heart rate since childhood, which has been investigated in the past with wired monitors but no definitive diagnosis was made. The patient is currently trying to quit smoking and has reduced her consumption to about two packs or less per week.        Review of Systems   Objective    BP (!) 140/91 (BP Location: Left Arm, Patient Position: Sitting, Cuff Size: Normal)   Pulse 86   Ht 5\' 9"  (1.753 m)   Wt 163 lb 14.4 oz (74.3 kg)   SpO2 100%   BMI 24.20 kg/m  Physical Exam Vitals reviewed.  Constitutional:      General: She is not in acute distress.    Appearance: Normal appearance. She is well-developed. She is not diaphoretic.  HENT:     Head: Normocephalic and atraumatic.  Eyes:     General: No scleral icterus.    Conjunctiva/sclera: Conjunctivae normal.  Neck:     Thyroid : No thyromegaly.  Cardiovascular:     Rate and Rhythm: Normal rate and regular rhythm.     Heart sounds: Normal heart sounds. No murmur heard. Pulmonary:     Effort: Pulmonary effort is normal. No respiratory distress.     Breath sounds: Normal breath sounds. No wheezing, rhonchi or rales.  Musculoskeletal:     Cervical back: Neck supple.     Right lower leg: No edema.     Left lower leg: No edema.  Lymphadenopathy:     Cervical: No cervical adenopathy.  Skin:    General: Skin is warm and dry.     Findings: No rash.  Neurological:     Mental Status: She is alert and oriented to person, place, and time.  Mental status is at baseline.  Psychiatric:        Mood and Affect: Mood normal.        Behavior: Behavior normal.       No results found for any visits on 05/30/23.  Assessment & Plan     Problem List Items Addressed This Visit       Respiratory   Pulmonary nodule - Primary   Relevant Orders   CT CHEST LCS NODULE F/U LOW DOSE WO CONTRAST     Other   Smoker   Relevant Orders   CT CHEST LCS NODULE F/U LOW DOSE WO CONTRAST   Other Visit Diagnoses       Palpitations       Relevant Orders   LONG TERM MONITOR (3-14 DAYS)   TSH   CBC w/Diff/Platelet   Hepatic function panel           Palpitations Experienced an episode of heart rate spiking to 150 bpm with chest pain while in [place], [place]. Possible atrial fibrillation was suggested but not  confirmed on EKG. No recurrence of symptoms since the episode. Differential includes SVT, which is benign, and AFib, which is more concerning. A Zio patch is recommended for further monitoring. Outcome statistics indicate 98% of arrhythmias will be caught within 14 days of monitoring. - Order Zio patch for 14-day monitoring of heart rhythm. - Review results with electrophysiologist to assess for arrhythmias. - check TSH - normal BMP  Lung nodule 5 mm nodule found in the right lower lobe of the lung on CT scan. Family history of lung cancer is significant. The nodule is too small for biopsy. Most nodules are benign, but follow-up is necessary due to family history. A follow-up CT scan in 6 months is planned to monitor for growth or changes. - Order follow-up chest CT in 6 months to monitor nodule growth using low-dose radiation.  Mild emphysema Mild emphysema noted on CT scan. Smoking is a contributing factor. No acute symptoms reported.  Tobacco use Smoker with a history of smoking two packs or less per week. She is attempting to quit smoking on her own and has reduced her usage. Smoking is a risk factor for emphysema and lung cancer. She prefers to quit without pharmacological aids at this time. - Support efforts to quit smoking. - Offer assistance with smoking cessation if needed in the future.  - 5 min discussion      No orders of the defined types were placed in this encounter.    Return in about 3 months (around 08/29/2023) for 3-83m , CPE.      Aden Agreste, MD  [facility] [phone] (phone) [phone] (fax)  [facility]

## 2023-05-31 LAB — CBC WITH DIFFERENTIAL/PLATELET
Basophils Absolute: 0.1 10*3/uL (ref 0.0–0.2)
Basos: 0 %
EOS (ABSOLUTE): 0.1 10*3/uL (ref 0.0–0.4)
Eos: 1 %
Hematocrit: 44.9 % (ref 34.0–46.6)
Hemoglobin: 15.2 g/dL (ref 11.1–15.9)
Immature Grans (Abs): 0.1 10*3/uL (ref 0.0–0.1)
Immature Granulocytes: 1 %
Lymphocytes Absolute: 3.5 10*3/uL — ABNORMAL HIGH (ref 0.7–3.1)
Lymphs: 29 %
MCH: 31 pg (ref 26.6–33.0)
MCHC: 33.9 g/dL (ref 31.5–35.7)
MCV: 91 fL (ref 79–97)
Monocytes Absolute: 0.7 10*3/uL (ref 0.1–0.9)
Monocytes: 5 %
Neutrophils Absolute: 7.8 10*3/uL — ABNORMAL HIGH (ref 1.4–7.0)
Neutrophils: 64 %
Platelets: 279 10*3/uL (ref 150–450)
RBC: 4.91 x10E6/uL (ref 3.77–5.28)
RDW: 11.9 % (ref 11.7–15.4)
WBC: 12.2 10*3/uL — ABNORMAL HIGH (ref 3.4–10.8)

## 2023-05-31 LAB — HEPATIC FUNCTION PANEL
ALT: 28 IU/L (ref 0–32)
AST: 21 IU/L (ref 0–40)
Albumin: 4.7 g/dL (ref 3.9–4.9)
Alkaline Phosphatase: 109 IU/L (ref 44–121)
Bilirubin Total: 0.2 mg/dL (ref 0.0–1.2)
Bilirubin, Direct: <0.08 mg/dL (ref 0.00–0.40)
Total Protein: 7.1 g/dL (ref 6.0–8.5)

## 2023-05-31 LAB — TSH: TSH: 1.5 u[IU]/mL (ref 0.450–4.500)

## 2023-06-01 ENCOUNTER — Encounter: Payer: Self-pay | Admitting: Family Medicine

## 2023-06-10 ENCOUNTER — Other Ambulatory Visit: Payer: Self-pay | Admitting: Physician Assistant

## 2023-06-10 DIAGNOSIS — F430 Acute stress reaction: Secondary | ICD-10-CM

## 2023-06-21 ENCOUNTER — Telehealth: Admitting: Physician Assistant

## 2023-06-21 DIAGNOSIS — R3989 Other symptoms and signs involving the genitourinary system: Secondary | ICD-10-CM | POA: Diagnosis not present

## 2023-06-22 MED ORDER — CEPHALEXIN 500 MG PO CAPS: 500.0000 mg | ORAL_CAPSULE | Freq: Two times a day (BID) | ORAL | 0 refills | Status: DC

## 2023-06-22 NOTE — Progress Notes (Signed)

## 2023-07-02 DIAGNOSIS — R002 Palpitations: Secondary | ICD-10-CM

## 2023-07-07 ENCOUNTER — Ambulatory Visit: Payer: Self-pay | Admitting: Family Medicine

## 2023-07-13 MED ORDER — METOPROLOL SUCCINATE ER 25 MG PO TB24: 25.0000 mg | ORAL_TABLET | Freq: Every day | ORAL | 3 refills | Status: DC

## 2023-08-22 ENCOUNTER — Encounter: Payer: Self-pay | Admitting: Family Medicine

## 2023-08-24 DIAGNOSIS — Z111 Encounter for screening for respiratory tuberculosis: Secondary | ICD-10-CM | POA: Diagnosis not present

## 2023-09-27 ENCOUNTER — Other Ambulatory Visit: Payer: Self-pay | Admitting: Family Medicine

## 2023-11-04 ENCOUNTER — Telehealth: Payer: Self-pay

## 2023-11-04 NOTE — Telephone Encounter (Signed)
 Copied from CRM [id number]. Topic: Clinical - Request for Lab/Test Order >> Nov 03, 2023 11:55 AM Tobias CROME wrote: Reason for CRM: Kylee with the [facility] requesting authorization to be completed for patient's low dose CT. Patient currently scheduled for 11/07/2023.

## 2023-11-07 ENCOUNTER — Ambulatory Visit

## 2023-11-09 ENCOUNTER — Ambulatory Visit
Admission: RE | Admit: 2023-11-09 | Discharge: 2023-11-09 | Disposition: A | Discharge status: Home / Self Care | Source: Ambulatory Visit | Attending: Family Medicine | Admitting: Family Medicine

## 2023-11-09 ENCOUNTER — Other Ambulatory Visit: Payer: Self-pay | Admitting: Medical Genetics

## 2023-11-09 DIAGNOSIS — F17200 Nicotine dependence, unspecified, uncomplicated: Secondary | ICD-10-CM | POA: Insufficient documentation

## 2023-11-09 DIAGNOSIS — R911 Solitary pulmonary nodule: Secondary | ICD-10-CM | POA: Diagnosis present

## 2023-11-29 ENCOUNTER — Encounter: Payer: Self-pay | Admitting: Family Medicine

## 2023-11-29 ENCOUNTER — Other Ambulatory Visit
Admission: RE | Admit: 2023-11-29 | Discharge: 2023-11-29 | Disposition: A | Discharge status: Home / Self Care | Payer: Self-pay | Source: Ambulatory Visit | Attending: Medical Genetics | Admitting: Medical Genetics

## 2023-11-29 ENCOUNTER — Ambulatory Visit (INDEPENDENT_AMBULATORY_CARE_PROVIDER_SITE_OTHER): Admitting: Family Medicine

## 2023-11-29 VITALS — BP 130/84 | HR 96 | Ht 69.0 in | Wt 167.4 lb

## 2023-11-29 DIAGNOSIS — Z1159 Encounter for screening for other viral diseases: Secondary | ICD-10-CM

## 2023-11-29 DIAGNOSIS — Z789 Other specified health status: Secondary | ICD-10-CM

## 2023-11-29 DIAGNOSIS — R3989 Other symptoms and signs involving the genitourinary system: Secondary | ICD-10-CM

## 2023-11-29 DIAGNOSIS — N951 Menopausal and female climacteric states: Secondary | ICD-10-CM | POA: Diagnosis not present

## 2023-11-29 DIAGNOSIS — Z0000 Encounter for general adult medical examination without abnormal findings: Secondary | ICD-10-CM

## 2023-11-29 DIAGNOSIS — E782 Mixed hyperlipidemia: Secondary | ICD-10-CM

## 2023-11-29 DIAGNOSIS — Z23 Encounter for immunization: Secondary | ICD-10-CM

## 2023-11-29 DIAGNOSIS — F17200 Nicotine dependence, unspecified, uncomplicated: Secondary | ICD-10-CM | POA: Diagnosis not present

## 2023-11-29 DIAGNOSIS — F430 Acute stress reaction: Secondary | ICD-10-CM | POA: Diagnosis not present

## 2023-11-29 DIAGNOSIS — Z0001 Encounter for general adult medical examination with abnormal findings: Secondary | ICD-10-CM | POA: Diagnosis not present

## 2023-11-29 DIAGNOSIS — Z1231 Encounter for screening mammogram for malignant neoplasm of breast: Secondary | ICD-10-CM

## 2023-11-29 DIAGNOSIS — I471 Supraventricular tachycardia, unspecified: Secondary | ICD-10-CM | POA: Insufficient documentation

## 2023-11-29 LAB — POCT URINALYSIS DIPSTICK
Bilirubin, UA: NEGATIVE
Blood, UA: NEGATIVE
Glucose, UA: NEGATIVE
Ketones, UA: NEGATIVE
Leukocytes, UA: NEGATIVE
Nitrite, UA: NEGATIVE
Protein, UA: NEGATIVE
Spec Grav, UA: 1.01 (ref 1.010–1.025)
Urobilinogen, UA: 0.2 U/dL
pH, UA: 6.5 (ref 5.0–8.0)

## 2023-11-29 MED ORDER — NICOTINE 7 MG/24HR TD PT24: 7.0000 mg | MEDICATED_PATCH | Freq: Every day | TRANSDERMAL | 2 refills | Status: AC

## 2023-11-29 MED ORDER — ESCITALOPRAM OXALATE 20 MG PO TABS: 20.0000 mg | ORAL_TABLET | Freq: Every day | ORAL | 1 refills | Status: AC

## 2023-11-29 MED ORDER — METOPROLOL SUCCINATE ER 50 MG PO TB24: 50.0000 mg | ORAL_TABLET | Freq: Every day | ORAL | 1 refills | Status: AC
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
## 2023-11-29 NOTE — Assessment & Plan Note (Signed)
 Persistent tachycardia with heart rate in the high 80s to 90s at rest, despite current metoprolol  25 mg daily. No significant drop in blood pressure noted. - Increase metoprolol  to 50 mg daily - Monitor heart rate and blood pressure

## 2023-12-05 DIAGNOSIS — E782 Mixed hyperlipidemia: Secondary | ICD-10-CM | POA: Diagnosis not present

## 2023-12-05 DIAGNOSIS — Z1159 Encounter for screening for other viral diseases: Secondary | ICD-10-CM | POA: Diagnosis not present

## 2023-12-05 DIAGNOSIS — Z789 Other specified health status: Secondary | ICD-10-CM | POA: Diagnosis not present

## 2023-12-06 ENCOUNTER — Ambulatory Visit: Payer: Self-pay | Admitting: Family Medicine

## 2023-12-06 LAB — COMPREHENSIVE METABOLIC PANEL WITH GFR
ALT: 20 IU/L (ref 0–32)
AST: 16 IU/L (ref 0–40)
Albumin: 4.4 g/dL (ref 3.9–4.9)
Alkaline Phosphatase: 97 IU/L (ref 41–116)
BUN/Creatinine Ratio: 13 (ref 9–23)
BUN: 9 mg/dL (ref 6–24)
Bilirubin Total: 0.4 mg/dL (ref 0.0–1.2)
CO2: 24 mmol/L (ref 20–29)
Calcium: 9.2 mg/dL (ref 8.7–10.2)
Chloride: 99 mmol/L (ref 96–106)
Creatinine, Ser: 0.71 mg/dL (ref 0.57–1.00)
Globulin, Total: 2.4 g/dL (ref 1.5–4.5)
Glucose: 93 mg/dL (ref 70–99)
Potassium: 4.1 mmol/L (ref 3.5–5.2)
Sodium: 137 mmol/L (ref 134–144)
Total Protein: 6.8 g/dL (ref 6.0–8.5)
eGFR: 106 mL/min/1.73 (ref >59)

## 2023-12-06 LAB — LIPID PANEL WITH LDL/HDL RATIO
Cholesterol, Total: 219 mg/dL — ABNORMAL HIGH (ref 100–199)
HDL: 34 mg/dL — ABNORMAL LOW (ref >39)
LDL Chol Calc (NIH): 151 mg/dL — ABNORMAL HIGH (ref 0–99)
LDL/HDL Ratio: 4.4 ratio — ABNORMAL HIGH (ref 0.0–3.2)
Triglycerides: 187 mg/dL — ABNORMAL HIGH (ref 0–149)
VLDL Cholesterol Cal: 34 mg/dL (ref 5–40)

## 2023-12-06 LAB — HEPATITIS C ANTIBODY: Hep C Virus Ab: NONREACTIVE

## 2023-12-06 LAB — HEPATITIS B SURFACE ANTIBODY,QUALITATIVE

## 2023-12-09 LAB — GENECONNECT MOLECULAR SCREEN: Genetic Analysis Overall Interpretation: NEGATIVE

## 2024-01-06 DIAGNOSIS — J209 Acute bronchitis, unspecified: Secondary | ICD-10-CM | POA: Diagnosis not present

## 2024-01-14 DIAGNOSIS — M25562 Pain in left knee: Secondary | ICD-10-CM | POA: Diagnosis not present
